# Patient Record
Sex: Male | Born: 1960 | Race: Black or African American | Hispanic: No | Marital: Single | Smoking: Former smoker
Health system: Southern US, Community
[De-identification: ages and names within clinical notes are randomized; demographics above are authoritative.]

## PROBLEM LIST (undated history)

## (undated) DIAGNOSIS — E7800 Pure hypercholesterolemia, unspecified: Secondary | ICD-10-CM

## (undated) DIAGNOSIS — K4090 Unilateral inguinal hernia, without obstruction or gangrene, not specified as recurrent: Secondary | ICD-10-CM

## (undated) DIAGNOSIS — M1990 Unspecified osteoarthritis, unspecified site: Secondary | ICD-10-CM

## (undated) DIAGNOSIS — R2232 Localized swelling, mass and lump, left upper limb: Secondary | ICD-10-CM

## (undated) HISTORY — PX: COLONOSCOPY: SHX174

## (undated) HISTORY — PX: HEMORROIDECTOMY: SUR656

## (undated) HISTORY — PX: HERNIA REPAIR: SHX51

---

## 1998-02-23 ENCOUNTER — Emergency Department (HOSPITAL_COMMUNITY): Admission: EM | Admit: 1998-02-23 | Discharge: 1998-02-23 | Payer: Self-pay | Admitting: Emergency Medicine

## 1998-02-23 ENCOUNTER — Encounter: Payer: Self-pay | Admitting: Emergency Medicine

## 1999-07-29 ENCOUNTER — Emergency Department (HOSPITAL_COMMUNITY): Admission: EM | Admit: 1999-07-29 | Discharge: 1999-07-29 | Payer: Self-pay | Admitting: *Deleted

## 2000-08-15 ENCOUNTER — Emergency Department (HOSPITAL_COMMUNITY): Admission: EM | Admit: 2000-08-15 | Discharge: 2000-08-15 | Payer: Self-pay | Admitting: Emergency Medicine

## 2000-11-21 ENCOUNTER — Emergency Department (HOSPITAL_COMMUNITY): Admission: EM | Admit: 2000-11-21 | Discharge: 2000-11-21 | Payer: Self-pay | Admitting: *Deleted

## 2000-11-21 ENCOUNTER — Encounter: Payer: Self-pay | Admitting: Emergency Medicine

## 2000-12-07 ENCOUNTER — Ambulatory Visit (HOSPITAL_COMMUNITY): Admission: RE | Admit: 2000-12-07 | Discharge: 2000-12-07 | Payer: Self-pay | Admitting: Emergency Medicine

## 2000-12-11 ENCOUNTER — Encounter: Admission: RE | Admit: 2000-12-11 | Discharge: 2000-12-11 | Payer: Self-pay | Admitting: Emergency Medicine

## 2000-12-18 ENCOUNTER — Ambulatory Visit (HOSPITAL_COMMUNITY): Admission: EM | Admit: 2000-12-18 | Discharge: 2000-12-18 | Payer: Self-pay | Admitting: Emergency Medicine

## 2001-01-01 ENCOUNTER — Ambulatory Visit (HOSPITAL_COMMUNITY): Admission: RE | Admit: 2001-01-01 | Discharge: 2001-01-01 | Payer: Self-pay | Admitting: Emergency Medicine

## 2002-06-21 ENCOUNTER — Emergency Department (HOSPITAL_COMMUNITY): Admission: EM | Admit: 2002-06-21 | Discharge: 2002-06-21 | Payer: Self-pay | Admitting: Emergency Medicine

## 2002-06-21 ENCOUNTER — Encounter: Payer: Self-pay | Admitting: Emergency Medicine

## 2002-06-24 ENCOUNTER — Emergency Department (HOSPITAL_COMMUNITY): Admission: EM | Admit: 2002-06-24 | Discharge: 2002-06-24 | Payer: Self-pay | Admitting: Emergency Medicine

## 2002-09-17 ENCOUNTER — Emergency Department (HOSPITAL_COMMUNITY): Admission: EM | Admit: 2002-09-17 | Discharge: 2002-09-17 | Payer: Self-pay | Admitting: Emergency Medicine

## 2002-09-19 ENCOUNTER — Emergency Department (HOSPITAL_COMMUNITY): Admission: EM | Admit: 2002-09-19 | Discharge: 2002-09-19 | Payer: Self-pay | Admitting: Emergency Medicine

## 2003-02-09 ENCOUNTER — Emergency Department (HOSPITAL_COMMUNITY): Admission: AD | Admit: 2003-02-09 | Discharge: 2003-02-09 | Payer: Self-pay | Admitting: Family Medicine

## 2003-05-31 ENCOUNTER — Emergency Department (HOSPITAL_COMMUNITY): Admission: EM | Admit: 2003-05-31 | Discharge: 2003-05-31 | Payer: Self-pay | Admitting: Family Medicine

## 2004-05-11 ENCOUNTER — Emergency Department (HOSPITAL_COMMUNITY): Admission: EM | Admit: 2004-05-11 | Discharge: 2004-05-11 | Payer: Self-pay | Admitting: Family Medicine

## 2004-11-28 ENCOUNTER — Emergency Department (HOSPITAL_COMMUNITY): Admission: EM | Admit: 2004-11-28 | Discharge: 2004-11-28 | Payer: Self-pay | Admitting: Family Medicine

## 2005-02-18 ENCOUNTER — Emergency Department (HOSPITAL_COMMUNITY): Admission: EM | Admit: 2005-02-18 | Discharge: 2005-02-18 | Payer: Self-pay | Admitting: Emergency Medicine

## 2005-04-01 ENCOUNTER — Emergency Department (HOSPITAL_COMMUNITY): Admission: EM | Admit: 2005-04-01 | Discharge: 2005-04-01 | Payer: Self-pay | Admitting: Family Medicine

## 2005-12-01 ENCOUNTER — Encounter: Admission: RE | Admit: 2005-12-01 | Discharge: 2005-12-01 | Payer: Self-pay | Admitting: Family Medicine

## 2006-07-13 ENCOUNTER — Encounter: Admission: RE | Admit: 2006-07-13 | Discharge: 2006-07-13 | Payer: Self-pay | Admitting: Family Medicine

## 2006-07-26 ENCOUNTER — Emergency Department (HOSPITAL_COMMUNITY): Admission: EM | Admit: 2006-07-26 | Discharge: 2006-07-26 | Payer: Self-pay | Admitting: Family Medicine

## 2007-08-28 ENCOUNTER — Emergency Department (HOSPITAL_COMMUNITY): Admission: EM | Admit: 2007-08-28 | Discharge: 2007-08-28 | Payer: Self-pay | Admitting: Emergency Medicine

## 2007-09-28 ENCOUNTER — Emergency Department (HOSPITAL_COMMUNITY): Admission: EM | Admit: 2007-09-28 | Discharge: 2007-09-28 | Payer: Self-pay | Admitting: Family Medicine

## 2009-02-17 ENCOUNTER — Emergency Department (HOSPITAL_COMMUNITY): Admission: EM | Admit: 2009-02-17 | Discharge: 2009-02-17 | Payer: Self-pay | Admitting: Emergency Medicine

## 2009-09-04 ENCOUNTER — Encounter: Admission: RE | Admit: 2009-09-04 | Discharge: 2009-09-04 | Payer: Self-pay | Admitting: General Surgery

## 2009-09-08 ENCOUNTER — Ambulatory Visit (HOSPITAL_BASED_OUTPATIENT_CLINIC_OR_DEPARTMENT_OTHER): Admission: RE | Admit: 2009-09-08 | Discharge: 2009-09-08 | Payer: Self-pay | Admitting: General Surgery

## 2010-04-17 LAB — POCT I-STAT, CHEM 8
BUN: 14 mg/dL (ref 6–23)
Calcium, Ion: 1.24 mmol/L (ref 1.12–1.32)
Chloride: 105 mEq/L (ref 96–112)
Creatinine, Ser: 1.1 mg/dL (ref 0.4–1.5)
Glucose, Bld: 91 mg/dL (ref 70–99)
HCT: 45 % (ref 39.0–52.0)
Hemoglobin: 15.3 g/dL (ref 13.0–17.0)
Potassium: 3.9 mEq/L (ref 3.5–5.1)
Sodium: 140 mEq/L (ref 135–145)
TCO2: 24 mmol/L (ref 0–100)

## 2010-05-31 ENCOUNTER — Emergency Department (HOSPITAL_COMMUNITY)
Admission: EM | Admit: 2010-05-31 | Discharge: 2010-05-31 | Disposition: A | Discharge status: Home / Self Care | Payer: Self-pay | Attending: Emergency Medicine | Admitting: Emergency Medicine

## 2010-05-31 DIAGNOSIS — T148XXA Other injury of unspecified body region, initial encounter: Secondary | ICD-10-CM | POA: Insufficient documentation

## 2010-05-31 DIAGNOSIS — M542 Cervicalgia: Secondary | ICD-10-CM | POA: Insufficient documentation
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
## 2010-10-30 LAB — POCT RAPID STREP A: Streptococcus, Group A Screen (Direct): POSITIVE — AB

## 2012-05-06 ENCOUNTER — Emergency Department (HOSPITAL_COMMUNITY)
Admission: EM | Admit: 2012-05-06 | Discharge: 2012-05-06 | Disposition: A | Discharge status: Home / Self Care | Payer: BC Managed Care – PPO | Attending: Emergency Medicine | Admitting: Emergency Medicine

## 2012-05-06 ENCOUNTER — Emergency Department (HOSPITAL_COMMUNITY): Payer: BC Managed Care – PPO

## 2012-05-06 ENCOUNTER — Encounter (HOSPITAL_COMMUNITY): Payer: Self-pay | Admitting: *Deleted

## 2012-05-06 DIAGNOSIS — R0789 Other chest pain: Secondary | ICD-10-CM | POA: Insufficient documentation

## 2012-05-06 DIAGNOSIS — Z880 Allergy status to penicillin: Secondary | ICD-10-CM | POA: Insufficient documentation

## 2012-05-06 DIAGNOSIS — Z86718 Personal history of other venous thrombosis and embolism: Secondary | ICD-10-CM | POA: Insufficient documentation

## 2012-05-06 DIAGNOSIS — R233 Spontaneous ecchymoses: Secondary | ICD-10-CM | POA: Insufficient documentation

## 2012-05-06 DIAGNOSIS — E7800 Pure hypercholesterolemia, unspecified: Secondary | ICD-10-CM | POA: Insufficient documentation

## 2012-05-06 DIAGNOSIS — R071 Chest pain on breathing: Secondary | ICD-10-CM | POA: Insufficient documentation

## 2012-05-06 HISTORY — DX: Pure hypercholesterolemia, unspecified: E78.00

## 2012-05-06 LAB — COMPREHENSIVE METABOLIC PANEL
ALT: 24 U/L (ref 0–53)
AST: 26 U/L (ref 0–37)
Albumin: 3.9 g/dL (ref 3.5–5.2)
Alkaline Phosphatase: 44 U/L (ref 39–117)
BUN: 15 mg/dL (ref 6–23)
CO2: 22 mEq/L (ref 19–32)
Calcium: 9.4 mg/dL (ref 8.4–10.5)
Chloride: 103 mEq/L (ref 96–112)
Creatinine, Ser: 0.91 mg/dL (ref 0.50–1.35)
GFR calc Af Amer: >90 mL/min (ref >90)
GFR calc non Af Amer: >90 mL/min (ref >90)
Glucose, Bld: 71 mg/dL (ref 70–99)
Potassium: 4 mEq/L (ref 3.5–5.1)
Sodium: 138 mEq/L (ref 135–145)
Total Bilirubin: 0.7 mg/dL (ref 0.3–1.2)
Total Protein: 7.2 g/dL (ref 6.0–8.3)

## 2012-05-06 LAB — CBC WITH DIFFERENTIAL/PLATELET
Basophils Absolute: 0 10*3/uL (ref 0.0–0.1)
Basophils Relative: 1 % (ref 0–1)
Eosinophils Absolute: 0.1 10*3/uL (ref 0.0–0.7)
Eosinophils Relative: 1 % (ref 0–5)
HCT: 40 % (ref 39.0–52.0)
Hemoglobin: 13.5 g/dL (ref 13.0–17.0)
Lymphocytes Relative: 20 % (ref 12–46)
Lymphs Abs: 1.4 10*3/uL (ref 0.7–4.0)
MCH: 28.9 pg (ref 26.0–34.0)
MCHC: 33.8 g/dL (ref 30.0–36.0)
MCV: 85.7 fL (ref 78.0–100.0)
Monocytes Absolute: 0.6 10*3/uL (ref 0.1–1.0)
Monocytes Relative: 8 % (ref 3–12)
Neutro Abs: 5 10*3/uL (ref 1.7–7.7)
Neutrophils Relative %: 71 % (ref 43–77)
Platelets: 194 10*3/uL (ref 150–400)
RBC: 4.67 MIL/uL (ref 4.22–5.81)
RDW: 13.7 % (ref 11.5–15.5)
WBC: 7.1 10*3/uL (ref 4.0–10.5)

## 2012-05-06 LAB — URINALYSIS, ROUTINE W REFLEX MICROSCOPIC
Bilirubin Urine: NEGATIVE
Glucose, UA: NEGATIVE mg/dL
Hgb urine dipstick: NEGATIVE
Ketones, ur: NEGATIVE mg/dL
Leukocytes, UA: NEGATIVE
Nitrite: NEGATIVE
Protein, ur: NEGATIVE mg/dL
Specific Gravity, Urine: 1.022 (ref 1.005–1.030)
Urobilinogen, UA: 0.2 mg/dL (ref 0.0–1.0)
pH: 6 (ref 5.0–8.0)

## 2012-05-06 LAB — TROPONIN I: Troponin I: <0.3 ng/mL (ref <0.30)

## 2012-05-06 MED ORDER — IBUPROFEN 800 MG PO TABS: 800.0000 mg | ORAL_TABLET | Freq: Three times a day (TID) | ORAL | Status: DC

## 2012-05-06 MED ORDER — IOHEXOL 350 MG/ML SOLN
100.0000 mL | Freq: Once | INTRAVENOUS | Status: AC | PRN
  Administered 2012-05-06: 100 mL via INTRAVENOUS

## 2012-05-06 NOTE — ED Notes (Signed)
Pt reports waking up this morning with bruise on left side of chest, with intermittent "soreness". Hx of superficial blood clot in right leg, pain similar to previous clot. Denies injury to chest. Pain does not radiate, but states bilat arms are "numb" from time to time. Not on blood thinners.

## 2012-05-06 NOTE — ED Notes (Addendum)
Pt aware of the need for a urine sample. Urinal at bedside. 

## 2012-05-06 NOTE — ED Notes (Signed)
Patient transported to CT 

## 2012-05-06 NOTE — ED Provider Notes (Signed)
History     CSN: [id number]  Arrival date & time 05/06/12  1038   First MD Initiated Contact with Patient 05/06/12 1046      Chief Complaint  Patient presents with  . Chest Pain    (Consider location/radiation/quality/duration/timing/severity/associated sxs/prior treatment) HPI Comments: Patient complains of left-sided chest pain has been constant since this morning at the site of the bruise. He does not know what caused the bruise.  He is worried because he was recently diagnosed with a blood clot in his right leg which was superficial. He is not on blood thinners .she was told to clot was not deep. Pain does not radiate it is worse with arm movement. Denies shortness of breath, cough or fever. Denies any cardiac history.  The history is provided by the patient.    Past Medical History  Diagnosis Date  . Hypercholesteremia     Past Surgical History  Procedure Laterality Date  . Hernia repair      No family history on file.  History  Substance Use Topics  . Smoking status: Never Smoker   . Smokeless tobacco: Not on file  . Alcohol Use: No      Review of Systems  Constitutional: Negative for activity change and appetite change.  Eyes: Negative for photophobia.  Respiratory: Positive for chest tightness. Negative for cough and shortness of breath.   Cardiovascular: Positive for chest pain.  Gastrointestinal: Negative for nausea, vomiting and abdominal pain.  Genitourinary: Negative for dysuria and hematuria.  Musculoskeletal: Negative for back pain.  Skin: Negative for rash.  Neurological: Negative for dizziness, weakness and headaches.  Hematological: Bruises/bleeds easily.  A complete 10 system review of systems was obtained and all systems are negative except as noted in the HPI and PMH.    Allergies  Penicillins  Home Medications   Current Outpatient Rx  Name  Route  Sig  Dispense  Refill  . ibuprofen (ADVIL,MOTRIN) 800 MG tablet   Oral   Take 1 tablet  (800 mg total) by mouth 3 (three) times daily.   21 tablet   0     BP 103/69  Pulse 52  Temp(Src) 98.7 F (37.1 C) (Oral)  Resp 13  SpO2 99%  Physical Exam  Constitutional: He is oriented to person, place, and time. He appears well-developed and well-nourished. No distress.  HENT:  Head: Normocephalic and atraumatic.  Mouth/Throat: Oropharynx is clear and moist. No oropharyngeal exudate.  Eyes: Conjunctivae and EOM are normal. Pupils are equal, round, and reactive to light.  Neck: Normal range of motion. Neck supple.  Cardiovascular: Normal rate, regular rhythm and normal heart sounds.   No murmur heard. Pulmonary/Chest: Effort normal and breath sounds normal. No respiratory distress. He exhibits tenderness.  1 cm area of ecchymosis to left chest wall it is tender  Abdominal: Soft. There is no tenderness. There is no rebound and no guarding.  Musculoskeletal: Normal range of motion. He exhibits no edema and no tenderness.  No calf asymmetry, no palpable cords  Neurological: He is alert and oriented to person, place, and time. No cranial nerve deficit. He exhibits normal muscle tone. Coordination normal.  Skin: Skin is warm.    ED Course  Procedures (including critical care time)  Labs Reviewed  CBC WITH DIFFERENTIAL  COMPREHENSIVE METABOLIC PANEL  TROPONIN I  URINALYSIS, ROUTINE W REFLEX MICROSCOPIC  CBC WITH DIFFERENTIAL   Dg Chest 2 View  05/06/2012  *RADIOLOGY REPORT*  Clinical Data: Chest pain  CHEST - 2  VIEW  Comparison: 04/03/2010  Findings: The heart size and mediastinal contours are within normal limits.  Both lungs are clear.  The visualized skeletal structures are unremarkable.  IMPRESSION: No active cardiopulmonary abnormalities.   Original Report Authenticated By: Signa Kell, M.D.    Ct Angio Chest Pe W/cm &/or Wo Cm  05/06/2012  *RADIOLOGY REPORT*  Clinical Data: Chest pain, history of deep venous thrombosis.  CT ANGIOGRAPHY CHEST  Technique:  Multidetector  CT imaging of the chest using the standard protocol during bolus administration of intravenous contrast. Multiplanar reconstructed images including MIPs were obtained and reviewed to evaluate the vascular anatomy.  Contrast: OMNIPAQUE IOHEXOL 350 MG/ML SOLN  Comparison: Chest radiograph 05/06/2012  Findings: There are no filling defects within the pulmonary arteries to suggest acute pulmonary embolism.  No acute findings of the aorta or great vessels.  No pericardial fluid.  Esophagus is normal.  No mediastinal lymphadenopathy.  No axillary or supraclavicular adenopathy.  Review of the lung parenchyma demonstrates no pneumothorax or pleural fluid.  Limited view of the upper abdomen is unremarkable.  Limited view of the skeleton demonstrates no evidence of fracture.  IMPRESSION: 1.  No evidence of acute pulmonary embolism. 2.  No acute thoracic abnormality.   Original Report Authenticated By: Genevive Bi, M.D.      1. Chest wall pain       MDM  Left-sided chest pain has been constant since this morning patient concerned because he has a recently diagnosed superficial blood clot in his right leg. Denies any shortness of breath nausea or vomiting.  Chest wall pain at site of ecchymosis. CTPE negative.  Treat chest wall pain supportively.  Low suspicion for ACS.   Date: 05/06/2012  Rate: 58  Rhythm: normal sinus rhythm  QRS Axis: normal  Intervals: normal  ST/T Wave abnormalities: normal  Conduction Disutrbances:none  Narrative Interpretation:   Old EKG Reviewed: unchanged      Glynn Octave, MD 05/06/12 1623

## 2012-05-08 ENCOUNTER — Emergency Department (HOSPITAL_COMMUNITY)
Admission: EM | Admit: 2012-05-08 | Discharge: 2012-05-08 | Disposition: A | Discharge status: Home / Self Care | Payer: BC Managed Care – PPO | Attending: Emergency Medicine | Admitting: Emergency Medicine

## 2012-05-08 ENCOUNTER — Encounter (HOSPITAL_COMMUNITY): Payer: Self-pay | Admitting: Emergency Medicine

## 2012-05-08 DIAGNOSIS — L0291 Cutaneous abscess, unspecified: Secondary | ICD-10-CM

## 2012-05-08 DIAGNOSIS — Z862 Personal history of diseases of the blood and blood-forming organs and certain disorders involving the immune mechanism: Secondary | ICD-10-CM | POA: Insufficient documentation

## 2012-05-08 DIAGNOSIS — L0231 Cutaneous abscess of buttock: Secondary | ICD-10-CM | POA: Insufficient documentation

## 2012-05-08 DIAGNOSIS — Z8639 Personal history of other endocrine, nutritional and metabolic disease: Secondary | ICD-10-CM | POA: Insufficient documentation

## 2012-05-08 NOTE — ED Notes (Signed)
Pt complain of abscess to left inner gluteal. Pt states abscess as being sore, no drainage or opened area noted.

## 2012-05-08 NOTE — ED Provider Notes (Signed)
History     CSN: [id number]  Arrival date & time 05/08/12  0506   First MD Initiated Contact with Patient 05/08/12 [phone]      Chief Complaint  Patient presents with  . Abscess    (Consider location/radiation/quality/duration/timing/severity/associated sxs/prior treatment) HPI Comments: 52 year old male presents emergency department complaining of an abscess to his left buttock region that he noticed 2 days ago. States the area has been increasing in size since. Area is painful only when he sits. He's been trying to apply warm compresses without relief. No drainage. Denies fever or chills.  Patient is a 52 y.o. male presenting with abscess. The history is provided by the patient.  Abscess Associated symptoms: no fever     Past Medical History  Diagnosis Date  . Hypercholesteremia     Past Surgical History  Procedure Laterality Date  . Hernia repair    . Cholecystectomy      History reviewed. No pertinent family history.  History  Substance Use Topics  . Smoking status: Never Smoker   . Smokeless tobacco: Not on file  . Alcohol Use: No      Review of Systems  Constitutional: Negative for fever and chills.  Skin:       Positive for abscess.  All other systems reviewed and are negative.    Allergies  Penicillins  Home Medications   Current Outpatient Rx  Name  Route  Sig  Dispense  Refill  . ibuprofen (ADVIL,MOTRIN) 800 MG tablet   Oral   Take 1 tablet (800 mg total) by mouth 3 (three) times daily.   21 tablet   0     BP 127/79  Pulse 65  Temp(Src) 97.6 F (36.4 C) (Oral)  Resp 19  Ht 5\' 9"  (1.753 m)  Wt 220 lb (99.791 kg)  BMI 32.47 kg/m2  SpO2 98%  Physical Exam  Nursing note and vitals reviewed. Constitutional: He is oriented to person, place, and time. He appears well-developed and well-nourished. No distress.  HENT:  Head: Normocephalic and atraumatic.  Mouth/Throat: Oropharynx is clear and moist.  Eyes: Conjunctivae are normal.   Neck: Normal range of motion. Neck supple.  Cardiovascular: Normal rate, regular rhythm and normal heart sounds.   Pulmonary/Chest: Effort normal and breath sounds normal.  Musculoskeletal: Normal range of motion. He exhibits no edema.  Neurological: He is alert and oriented to person, place, and time.  Skin: Skin is warm and dry. He is not diaphoretic.  1 inch diameter indurated abscess without surrounding cellulitis present on left buttock region. No fluctuance or drainage. Tender to palpation.  Psychiatric: He has a normal mood and affect. His behavior is normal.    ED Course  Procedures (including critical care time) INCISION AND DRAINAGE Performed by: Johnnette Gourd Consent: Verbal consent obtained. Risks and benefits: risks, benefits and alternatives were discussed Type: abscess  Body area: left buttock  Anesthesia: local infiltration  Incision was made with a scalpel.  Local anesthetic: lidocaine 2% with epinephrine  Anesthetic total: 3 ml  Complexity: complex Blunt dissection to break up loculations  Drainage: purulent  Drainage amount: large  Patient tolerance: Patient tolerated the procedure well with no immediate complications.     1. Abscess       MDM  52 y/o male with abscess to left buttock. No surrounding cellulitis. Abscess drained with large amount of purulent material expressed. He will f/u with his PCP in 2 days for recheck. Advised him to use warm compresses. Afebrile, normal vital  signs. Return precautions discussed. Patient states understanding of plan and is agreeable.         Trevor Mace, PA-C 05/08/12 [phone]

## 2012-05-09 NOTE — ED Provider Notes (Signed)
Medical screening examination/treatment/procedure(s) were performed by non-physician practitioner and as supervising physician I was immediately available for consultation/collaboration.  Sunnie Nielsen, MD 05/09/12 [phone]

## 2012-10-24 ENCOUNTER — Ambulatory Visit: Payer: Self-pay | Admitting: Medical

## 2013-01-10 ENCOUNTER — Ambulatory Visit (INDEPENDENT_AMBULATORY_CARE_PROVIDER_SITE_OTHER): Payer: BC Managed Care – PPO

## 2013-01-10 VITALS — BP 116/69 | HR 53 | Resp 24 | Ht 69.0 in | Wt 220.0 lb

## 2013-01-10 DIAGNOSIS — M79671 Pain in right foot: Secondary | ICD-10-CM

## 2013-01-10 DIAGNOSIS — M79609 Pain in unspecified limb: Secondary | ICD-10-CM

## 2013-01-10 DIAGNOSIS — M722 Plantar fascial fibromatosis: Secondary | ICD-10-CM

## 2013-01-10 MED ORDER — MELOXICAM 15 MG PO TABS: 15.0000 mg | ORAL_TABLET | Freq: Every day | ORAL | Status: DC

## 2013-01-10 NOTE — Progress Notes (Signed)
   Subjective:    Patient ID: Adam Barber, male    DOB: 1960/09/02, 52 y.o.   MRN: [id number]  "My heel, I think there's a bone spur in it.  I had one doctor look at it and pump some cortisone in there."  Foot Pain This is a new ( Heel Pain) problem. Episode onset: 3 months. The problem occurs daily. The problem has been gradually worsening. The symptoms are aggravated by walking and standing (working). Treatments tried: Podiatrist over off [address], cortisone shot,  The treatment provided mild relief.      Review of Systems  Musculoskeletal: Positive for back pain.  All other systems reviewed and are negative.       Objective:   Physical Exam Neurovascular status is intact with pedal pulses palpable bilateral. Epicritic and proprioceptive sensations intact and symmetric. X-rays reveal retrocalcaneal spurring on lateral projection no inferior calcaneal spur. No fractures or other S. abnormalities noted. There is thickening of fascial structures noted radiographically. Clinically there is pain on palpation Magan plantar fascia from mid arch inferior calcaneal tubercle on the right left foot is asymptomatic. Patient wearing shoes that are flexing in the mid arch not toe area. Neurologically skin color pigment normal hair growth absent nails somewhat criptotic no other findings or changes no open wounds or ulcerations noted       Assessment & Plan:  Assessment plantar fasciitis/heel spur syndrome right foot plan at this time patient placed in a fascial strapping of the right foot prescription for Mobic 15 mg daily x30 tablets with refills also ice to the heel every evening maintain good stable shoes at all times considered crocs around the house. Reappointed in 2-3 weeks for followup  Alvan Dame DPM

## 2013-01-10 NOTE — Patient Instructions (Signed)

## 2013-01-15 ENCOUNTER — Telehealth: Payer: Self-pay | Admitting: *Deleted

## 2013-01-15 NOTE — Telephone Encounter (Signed)
Pt ask what kind of shoes he should wear.  Dr Dionne Bucy last note states he needs to wear shoes that are firm in the arch, and bend in the toe area, and Crocs around the house.  I left those orders on pt's voicemail.

## 2013-01-31 ENCOUNTER — Ambulatory Visit: Payer: BC Managed Care – PPO

## 2014-02-04 IMAGING — CT CT ANGIO CHEST
1 of 2 series · 19 of 32 positions shown · IV contrast (OMNIPAQUE 350)
Comparison: Chest radiograph 05/06/2012

CLINICAL DATA: Chest pain, history of deep venous thrombosis.

CT ANGIOGRAPHY CHEST
TECHNIQUE: Multidetector CT imaging of the chest using the
standard protocol during bolus administration of intravenous
contrast. Multiplanar reconstructed images including MIPs were
obtained and reviewed to evaluate the vascular anatomy.
Contrast: 100mL OMNIPAQUE IOHEXOL 350 MG/ML SOLN

[Series 8: thins for pacs · axial · 0.74mm/px · z∈[+1624,+1826]mm · 19 of 226 slices shown]
[im 12/226  lung]
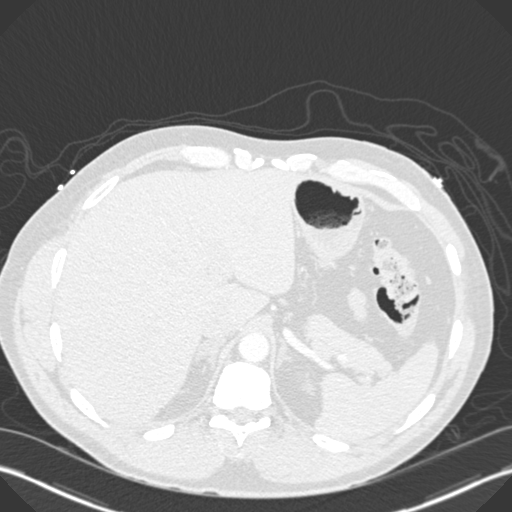
[im 23/226  mediastinal]
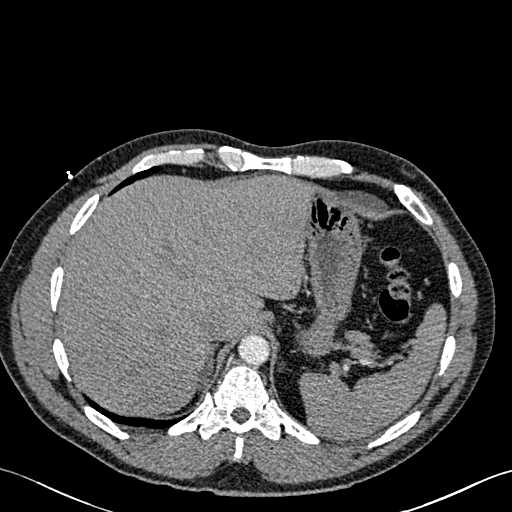
[im 34/226  lung]
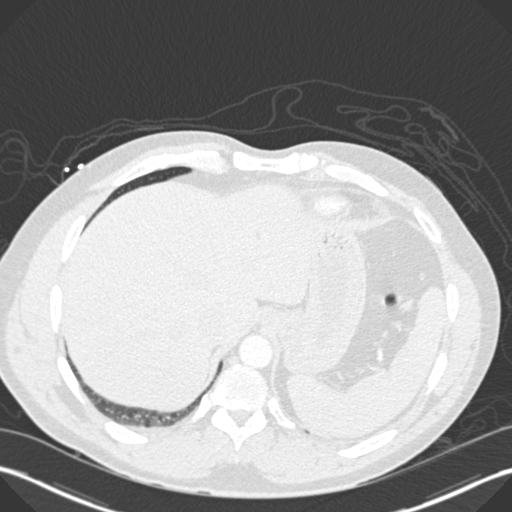
[im 57/226  mediastinal]
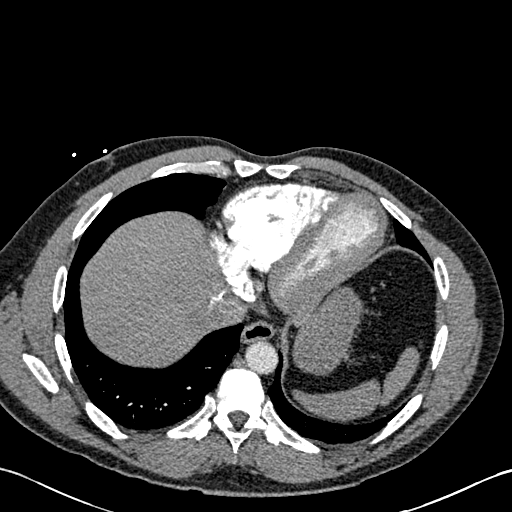
[im 68/226  lung]
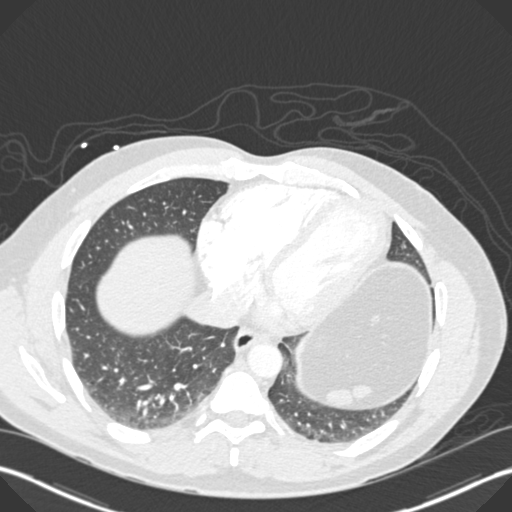
[im 76/226  mediastinal]
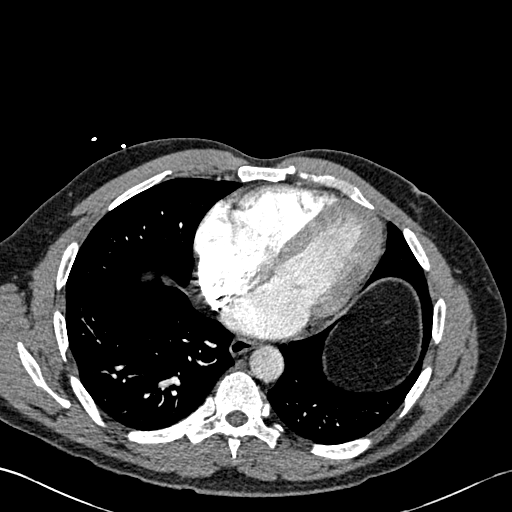
[im 79/226  lung]
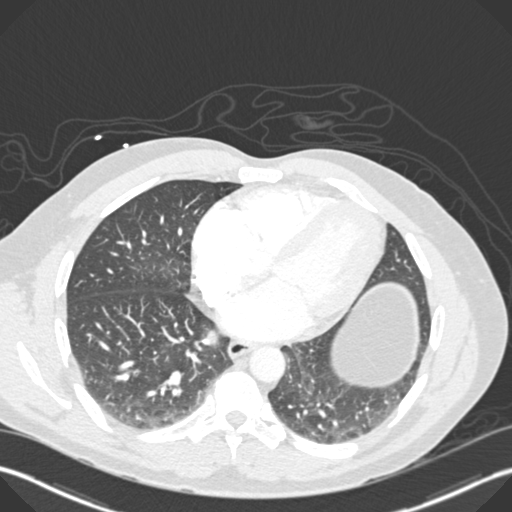
[im 91/226  mediastinal]
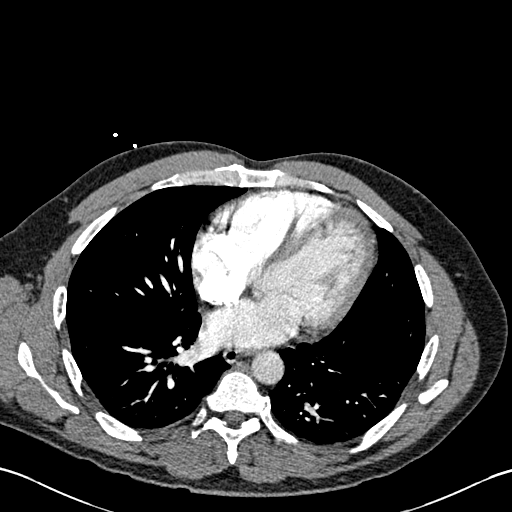
[im 102/226  lung]
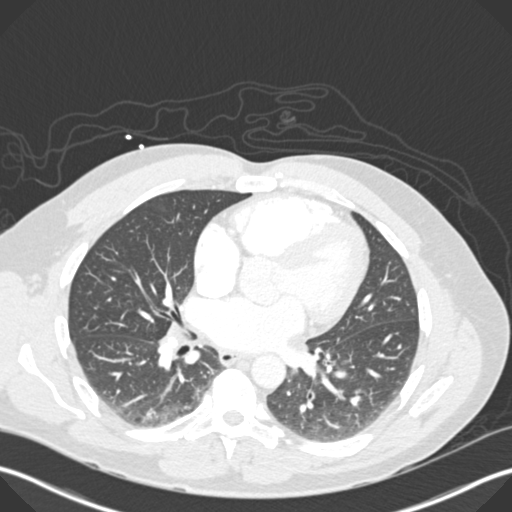
[im 113/226  mediastinal]
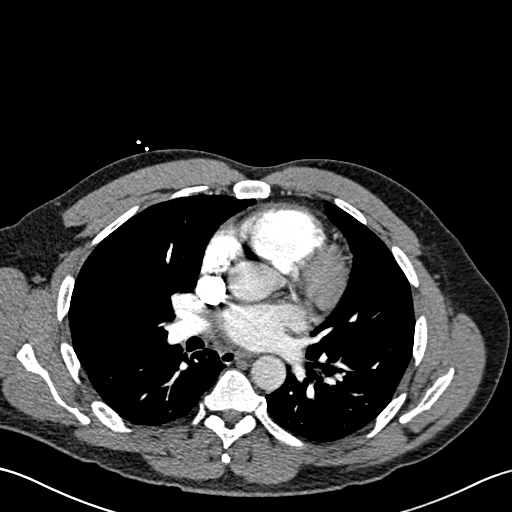
[im 124/226  lung]
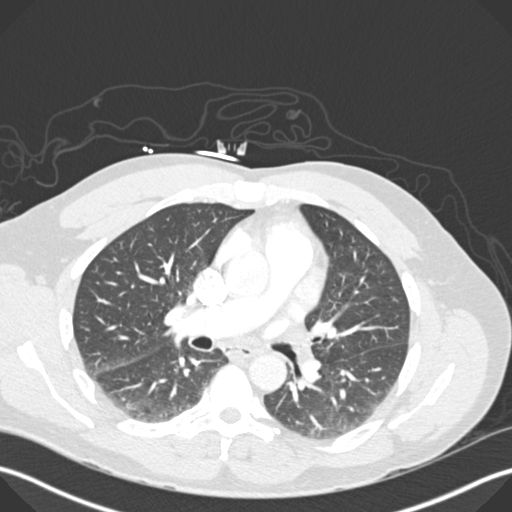
[im 136/226  mediastinal]
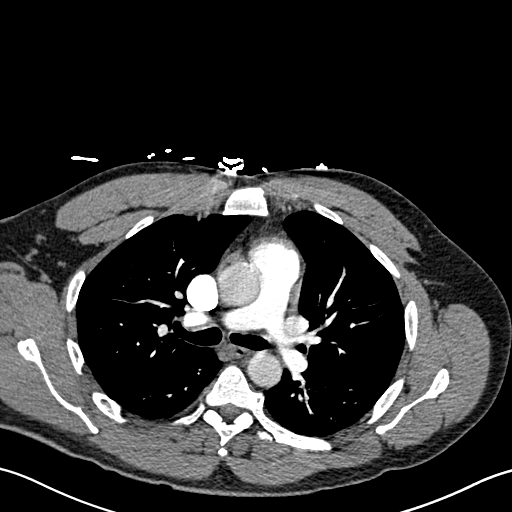
[im 147/226  lung]
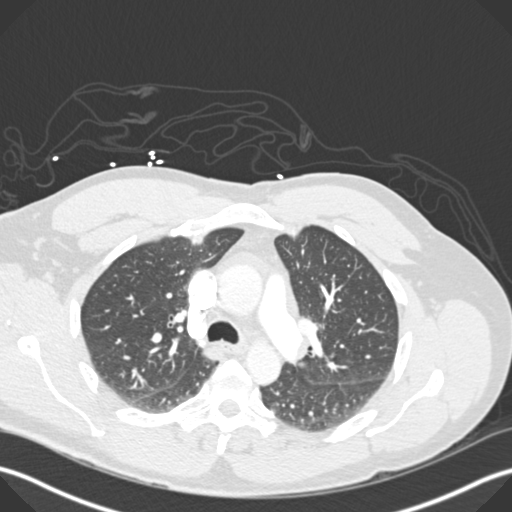
[im 151/226  mediastinal]
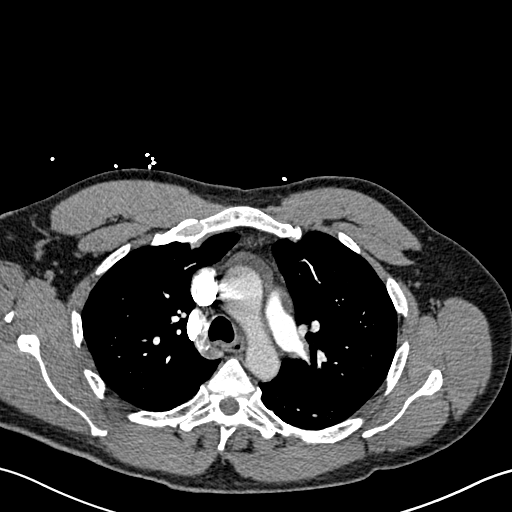
[im 158/226  lung]
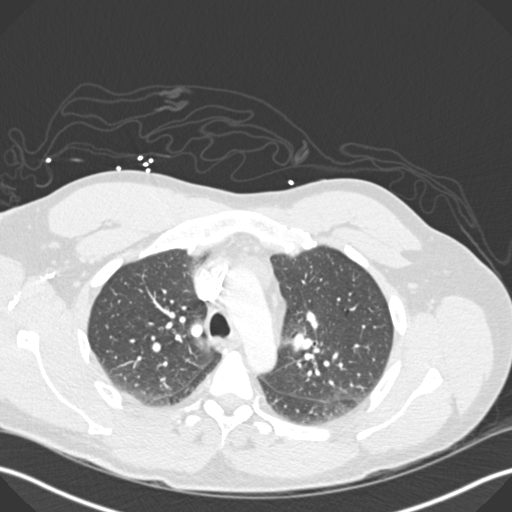
[im 169/226  mediastinal]
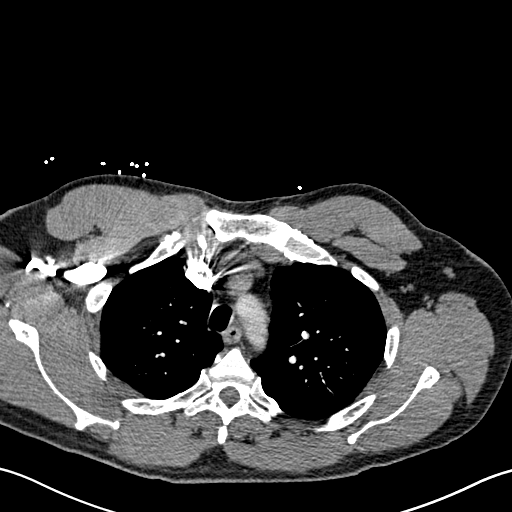
[im 192/226  lung]
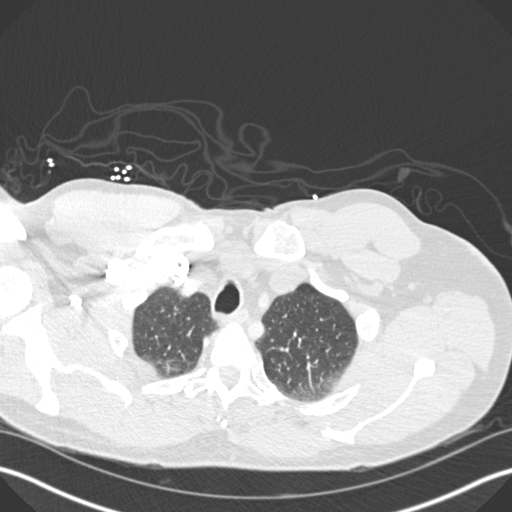
[im 203/226  mediastinal]
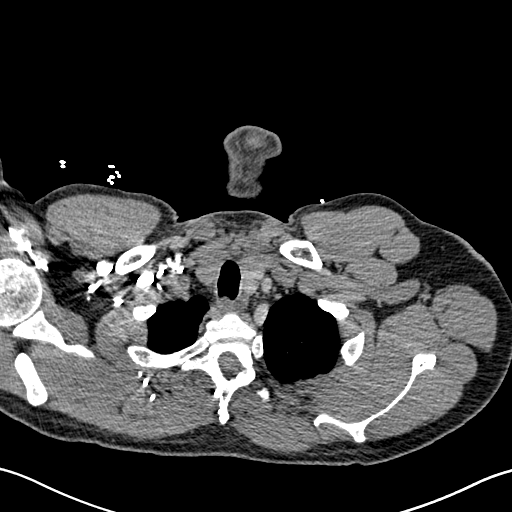
[im 214/226  lung]
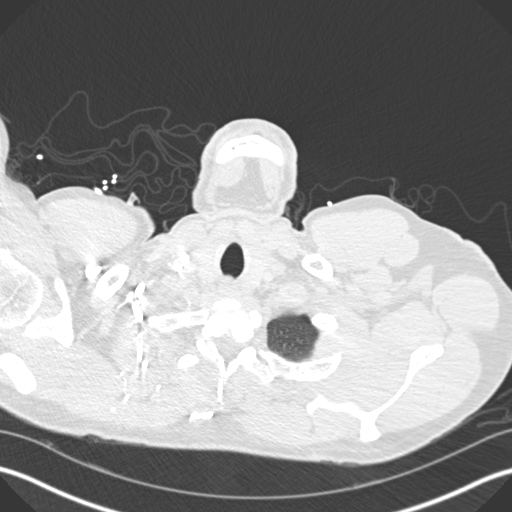

[19 of 32 positions shown; findings below may reference images not displayed]

FINDINGS: There are no filling defects within the pulmonary
arteries to suggest acute pulmonary embolism.  No acute findings of
the aorta or great vessels.  No pericardial fluid.  Esophagus is
normal.  No mediastinal lymphadenopathy.  No axillary or
supraclavicular adenopathy.

Review of the lung parenchyma demonstrates no pneumothorax or
pleural fluid.

Limited view of the upper abdomen is unremarkable.

Limited view of the skeleton demonstrates no evidence of fracture.
IMPRESSION: 1.  No evidence of acute pulmonary embolism.
2.  No acute thoracic abnormality.

## 2015-01-30 ENCOUNTER — Other Ambulatory Visit: Payer: Self-pay | Admitting: Surgery

## 2015-01-31 ENCOUNTER — Encounter (HOSPITAL_BASED_OUTPATIENT_CLINIC_OR_DEPARTMENT_OTHER): Payer: Self-pay | Admitting: *Deleted

## 2015-02-05 NOTE — H&P (Signed)
Adam Barber 01/30/2015 9:25 AM Location: [facility] Patient #: [id number] DOB: [birth year] Single / Language: [place] / Race: Black or African American Male   History of Present Illness (Herbert Aguinaldo A. Magnus IvanBlackman MD; 01/30/2015 9:36 AM) Patient words: New-Hernia.  The patient is a 55 year old male who presents with an inguinal hernia. This is a pleasant gentleman referred to me by Dr. Lupe Carneyean Mitchell for evaluation of a right inguinal hernia. He reports noticing the hernia about a year ago. It is now getting larger and causing some slight discomfort. The pain is described as very mild and a dull ache. It is intermittent. He reports that the hernia always easily reduces. He has no obstructive symptoms. He has had a previous open left inguinal hernia repair approximately 5 years ago. He is otherwise without complaints.   Other Problems Doristine Devoid(Chemira Jones, CMA; 01/30/2015 9:25 AM) Back Pain Hypercholesterolemia  Diagnostic Studies History Doristine Devoid(Chemira Jones, CMA; 01/30/2015 9:25 AM) Colonoscopy 5-10 years ago  Allergies Doristine Devoid(Chemira Jones, CMA; 01/30/2015 9:26 AM) Penicillin G Sodium *PENICILLINS*  Medication History Doristine Devoid(Chemira Jones, CMA; 01/30/2015 9:26 AM) No Current Medications Medications Reconciled  Social History Doristine Devoid(Chemira Jones, CMA; 01/30/2015 9:25 AM) Caffeine use Coffee. No drug use Tobacco use Former smoker.  Family History Doristine Devoid(Chemira Jones, CMA; 01/30/2015 9:25 AM) Hypertension Sister.    Review of Systems Doristine Devoid(Chemira Jones CMA; 01/30/2015 9:25 AM) General Present- Weight Gain. Not Present- Appetite Loss, Chills, Fatigue, Fever, Night Sweats and Weight Loss. Skin Not Present- Change in Wart/Mole, Dryness, Hives, Jaundice, New Lesions, Non-Healing Wounds, Rash and Ulcer. HEENT Not Present- Earache, Hearing Loss, Hoarseness, Nose Bleed, Oral Ulcers, Ringing in the Ears, Seasonal Allergies, Sinus Pain, Sore Throat, Visual Disturbances, Wears glasses/contact lenses  and Yellow Eyes. Respiratory Not Present- Bloody sputum, Chronic Cough, Difficulty Breathing, Snoring and Wheezing. Breast Not Present- Breast Mass, Breast Pain, Nipple Discharge and Skin Changes. Cardiovascular Not Present- Chest Pain, Difficulty Breathing Lying Down, Leg Cramps, Palpitations, Rapid Heart Rate, Shortness of Breath and Swelling of Extremities. Gastrointestinal Not Present- Abdominal Pain, Bloating, Bloody Stool, Change in Bowel Habits, Chronic diarrhea, Constipation, Difficulty Swallowing, Excessive gas, Gets full quickly at meals, Hemorrhoids, Indigestion, Nausea, Rectal Pain and Vomiting. Male Genitourinary Not Present- Blood in Urine, Change in Urinary Stream, Frequency, Impotence, Nocturia, Painful Urination, Urgency and Urine Leakage. Musculoskeletal Present- Back Pain. Not Present- Joint Pain, Joint Stiffness, Muscle Pain, Muscle Weakness and Swelling of Extremities. Neurological Present- Trouble walking. Not Present- Decreased Memory, Fainting, Headaches, Numbness, Seizures, Tingling, Tremor and Weakness. Psychiatric Not Present- Anxiety, Bipolar, Change in Sleep Pattern, Depression, Fearful and Frequent crying. Endocrine Not Present- Cold Intolerance, Excessive Hunger, Hair Changes, Heat Intolerance, Hot flashes and New Diabetes. Hematology Not Present- Easy Bruising, Excessive bleeding, Gland problems, HIV and Persistent Infections.  Vitals (Chemira Jones CMA; 01/30/2015 9:26 AM) 01/30/2015 9:25 AM Weight: 208 lb Height: 69in Body Surface Area: 2.1 m Body Mass Index: 30.72 kg/m  Temp.: 98.47F(Oral)  Pulse: 70 (Regular)  BP: 114/68 (Sitting, Left Arm, Standard)       Physical Exam (Kaliana Albino A. Magnus IvanBlackman MD; 01/30/2015 9:36 AM) General Mental Status-Alert. General Appearance-Consistent with stated age. Hydration-Well hydrated. Voice-Normal.  Head and Neck Head-normocephalic, atraumatic with no lesions or palpable  masses. Trachea-midline.  Eye Eyeball - Bilateral-Extraocular movements intact. Sclera/Conjunctiva - Bilateral-No scleral icterus.  Chest and Lung Exam Chest and lung exam reveals -quiet, even and easy respiratory effort with no use of accessory muscles and on auscultation, normal breath sounds, no adventitious sounds and normal vocal resonance. Inspection Chest  Wall - Normal. Back - normal.  Cardiovascular Cardiovascular examination reveals -normal heart sounds, regular rate and rhythm with no murmurs and normal pedal pulses bilaterally.  Abdomen Inspection Skin - Scar - no surgical scars. Hernias - Inguinal hernia - Right - Reducible. Palpation/Percussion Palpation and Percussion of the abdomen reveal - Soft, Non Tender, No Rebound tenderness, No Rigidity (guarding) and No hepatosplenomegaly. Auscultation Auscultation of the abdomen reveals - Bowel sounds normal.  Neurologic Neurologic evaluation reveals -alert and oriented x 3 with no impairment of recent or remote memory. Mental Status-Normal.  Musculoskeletal Normal Exam - Left-Upper Extremity Strength Normal and Lower Extremity Strength Normal. Normal Exam - Right-Upper Extremity Strength Normal, Lower Extremity Weakness.    Assessment & Plan (Zaniel Marineau A. Magnus Ivan MD; 01/30/2015 9:37 AM) RIGHT INGUINAL HERNIA (K40.90) Impression: I discussed the diagnosis with him in detail. As he has had previous open hernia repair with mesh, he is interested in laparoscopic right inguinal hernia repair with mesh. I discussed the surgical procedure with him in detail. I discussed the risks of surgery which includes but is not limited to bleeding, infection, recurrent hernia, injury to surrounding structures, chronic pain, nerve entrapment, postoperative recovery, etc. I gave him literature regarding this. He understands and wishes to proceed with laparoscopic right inguinal hernia repair with mesh Current Plans Pt  Education - Pamphlet Given - Laparoscopic Hernia Repair: discussed with patient and provided information.

## 2015-02-06 ENCOUNTER — Ambulatory Visit (HOSPITAL_BASED_OUTPATIENT_CLINIC_OR_DEPARTMENT_OTHER): Payer: BC Managed Care – PPO | Admitting: Anesthesiology

## 2015-02-06 ENCOUNTER — Encounter (HOSPITAL_BASED_OUTPATIENT_CLINIC_OR_DEPARTMENT_OTHER)
Admission: RE | Disposition: A | Discharge status: Home / Self Care | Payer: Self-pay | Source: Ambulatory Visit | Attending: Surgery

## 2015-02-06 ENCOUNTER — Ambulatory Visit (HOSPITAL_BASED_OUTPATIENT_CLINIC_OR_DEPARTMENT_OTHER)
Admission: RE | Admit: 2015-02-06 | Discharge: 2015-02-06 | Disposition: A | Discharge status: Home / Self Care | Payer: BC Managed Care – PPO | Source: Ambulatory Visit | Attending: Surgery | Admitting: Surgery

## 2015-02-06 ENCOUNTER — Encounter (HOSPITAL_BASED_OUTPATIENT_CLINIC_OR_DEPARTMENT_OTHER): Payer: Self-pay | Admitting: *Deleted

## 2015-02-06 DIAGNOSIS — M1990 Unspecified osteoarthritis, unspecified site: Secondary | ICD-10-CM | POA: Diagnosis not present

## 2015-02-06 DIAGNOSIS — Z5331 Laparoscopic surgical procedure converted to open procedure: Secondary | ICD-10-CM | POA: Diagnosis not present

## 2015-02-06 DIAGNOSIS — Z6830 Body mass index (BMI) 30.0-30.9, adult: Secondary | ICD-10-CM | POA: Diagnosis not present

## 2015-02-06 DIAGNOSIS — E7800 Pure hypercholesterolemia, unspecified: Secondary | ICD-10-CM | POA: Insufficient documentation

## 2015-02-06 DIAGNOSIS — Z87891 Personal history of nicotine dependence: Secondary | ICD-10-CM | POA: Diagnosis not present

## 2015-02-06 DIAGNOSIS — E669 Obesity, unspecified: Secondary | ICD-10-CM | POA: Diagnosis not present

## 2015-02-06 DIAGNOSIS — K4090 Unilateral inguinal hernia, without obstruction or gangrene, not specified as recurrent: Secondary | ICD-10-CM | POA: Diagnosis not present

## 2015-02-06 HISTORY — PX: INGUINAL HERNIA REPAIR: SHX194

## 2015-02-06 HISTORY — PX: INSERTION OF MESH: SHX5868

## 2015-02-06 HISTORY — DX: Unspecified osteoarthritis, unspecified site: M19.90

## 2015-02-06 HISTORY — DX: Unilateral inguinal hernia, without obstruction or gangrene, not specified as recurrent: K40.90

## 2015-02-06 SURGERY — REPAIR, HERNIA, INGUINAL, LAPAROSCOPIC
Anesthesia: General | Site: Abdomen | Laterality: Right

## 2015-02-06 MED ORDER — DEXAMETHASONE SODIUM PHOSPHATE 4 MG/ML IJ SOLN
INTRAMUSCULAR | Status: DC | PRN
  Administered 2015-02-06: 10 mg via INTRAVENOUS

## 2015-02-06 MED ORDER — LIDOCAINE HCL (CARDIAC) 10 MG/ML IV SOLN
INTRAVENOUS | Status: DC | PRN
  Administered 2015-02-06: 60 mg via INTRAVENOUS

## 2015-02-06 MED ORDER — HYDROCODONE-ACETAMINOPHEN 5-325 MG PO TABS: 1.0000 | ORAL_TABLET | ORAL | Status: DC | PRN

## 2015-02-06 MED ORDER — PROPOFOL 10 MG/ML IV BOLUS
INTRAVENOUS | Status: DC | PRN
  Administered 2015-02-06: 200 mg via INTRAVENOUS

## 2015-02-06 MED ORDER — CIPROFLOXACIN IN D5W 400 MG/200ML IV SOLN
400.0000 mg | INTRAVENOUS | Status: AC
  Administered 2015-02-06: 400 mg via INTRAVENOUS

## 2015-02-06 MED ORDER — MIDAZOLAM HCL 2 MG/2ML IJ SOLN
INTRAMUSCULAR | Status: AC
  Filled 2015-02-06: qty 2

## 2015-02-06 MED ORDER — OXYCODONE HCL 5 MG PO TABS
5.0000 mg | ORAL_TABLET | Freq: Once | ORAL | Status: AC | PRN
  Administered 2015-02-06: 5 mg via ORAL

## 2015-02-06 MED ORDER — KETOROLAC TROMETHAMINE 30 MG/ML IJ SOLN
INTRAMUSCULAR | Status: AC
  Filled 2015-02-06: qty 1

## 2015-02-06 MED ORDER — FENTANYL CITRATE (PF) 100 MCG/2ML IJ SOLN
INTRAMUSCULAR | Status: AC
  Filled 2015-02-06: qty 2

## 2015-02-06 MED ORDER — ONDANSETRON HCL 4 MG/2ML IJ SOLN
INTRAMUSCULAR | Status: AC
  Filled 2015-02-06: qty 2

## 2015-02-06 MED ORDER — ROCURONIUM BROMIDE 50 MG/5ML IV SOLN
INTRAVENOUS | Status: AC
  Filled 2015-02-06: qty 1

## 2015-02-06 MED ORDER — DEXAMETHASONE SODIUM PHOSPHATE 10 MG/ML IJ SOLN
INTRAMUSCULAR | Status: AC
  Filled 2015-02-06: qty 1

## 2015-02-06 MED ORDER — MIDAZOLAM HCL 2 MG/2ML IJ SOLN
1.0000 mg | INTRAMUSCULAR | Status: DC | PRN
  Administered 2015-02-06: 2 mg via INTRAVENOUS

## 2015-02-06 MED ORDER — ONDANSETRON HCL 4 MG/2ML IJ SOLN: 4.0000 mg | Freq: Four times a day (QID) | INTRAMUSCULAR | Status: DC | PRN

## 2015-02-06 MED ORDER — SUGAMMADEX SODIUM 200 MG/2ML IV SOLN
INTRAVENOUS | Status: AC
  Filled 2015-02-06: qty 2

## 2015-02-06 MED ORDER — BUPIVACAINE-EPINEPHRINE (PF) 0.5% -1:200000 IJ SOLN
INTRAMUSCULAR | Status: DC | PRN
  Administered 2015-02-06: 30 mL via PERINEURAL

## 2015-02-06 MED ORDER — HYDROMORPHONE HCL 1 MG/ML IJ SOLN: 0.2500 mg | INTRAMUSCULAR | Status: DC | PRN

## 2015-02-06 MED ORDER — CIPROFLOXACIN IN D5W 400 MG/200ML IV SOLN
INTRAVENOUS | Status: AC
  Filled 2015-02-06: qty 200

## 2015-02-06 MED ORDER — ONDANSETRON HCL 4 MG/2ML IJ SOLN
INTRAMUSCULAR | Status: DC | PRN
  Administered 2015-02-06: 4 mg via INTRAVENOUS

## 2015-02-06 MED ORDER — SUGAMMADEX SODIUM 500 MG/5ML IV SOLN
INTRAVENOUS | Status: DC | PRN
  Administered 2015-02-06: 200 mg via INTRAVENOUS

## 2015-02-06 MED ORDER — OXYCODONE HCL 5 MG PO TABS
ORAL_TABLET | ORAL | Status: AC
  Filled 2015-02-06: qty 1

## 2015-02-06 MED ORDER — GLYCOPYRROLATE 0.2 MG/ML IJ SOLN: 0.2000 mg | Freq: Once | INTRAMUSCULAR | Status: DC | PRN

## 2015-02-06 MED ORDER — LIDOCAINE HCL (CARDIAC) 20 MG/ML IV SOLN
INTRAVENOUS | Status: AC
  Filled 2015-02-06: qty 5

## 2015-02-06 MED ORDER — ROCURONIUM BROMIDE 100 MG/10ML IV SOLN
INTRAVENOUS | Status: DC | PRN
  Administered 2015-02-06: 10 mg via INTRAVENOUS
  Administered 2015-02-06: 30 mg via INTRAVENOUS

## 2015-02-06 MED ORDER — OXYCODONE HCL 5 MG/5ML PO SOLN: 5.0000 mg | Freq: Once | ORAL | Status: AC | PRN

## 2015-02-06 MED ORDER — SCOPOLAMINE 1 MG/3DAYS TD PT72: 1.0000 | MEDICATED_PATCH | Freq: Once | TRANSDERMAL | Status: DC | PRN

## 2015-02-06 MED ORDER — FENTANYL CITRATE (PF) 100 MCG/2ML IJ SOLN
50.0000 ug | INTRAMUSCULAR | Status: AC | PRN
  Administered 2015-02-06: 50 ug via INTRAVENOUS
  Administered 2015-02-06: 100 ug via INTRAVENOUS
  Administered 2015-02-06: 50 ug via INTRAVENOUS

## 2015-02-06 MED ORDER — LACTATED RINGERS IV SOLN
INTRAVENOUS | Status: DC
  Administered 2015-02-06: 14:00:00 via INTRAVENOUS
  Administered 2015-02-06: 10 mL/h via INTRAVENOUS

## 2015-02-06 MED ORDER — KETOROLAC TROMETHAMINE 30 MG/ML IJ SOLN
INTRAMUSCULAR | Status: DC | PRN
  Administered 2015-02-06: 30 mg via INTRAVENOUS

## 2015-02-06 SURGICAL SUPPLY — 37 items
APPLIER CLIP LOGIC TI 5 (MISCELLANEOUS) ×1 IMPLANT
APR CLP MED LRG 33X5 (MISCELLANEOUS) ×2
BLADE CLIPPER SURG (BLADE) ×1 IMPLANT
BLADE HEX COATED 2.75 (ELECTRODE) ×1 IMPLANT
CHLORAPREP W/TINT 26ML (MISCELLANEOUS) ×3 IMPLANT
DEVICE SECURE STRAP 25 ABSORB (INSTRUMENTS) ×3 IMPLANT
DISSECT BALLN SPACEMKR + OVL (BALLOONS) ×6
DISSECTOR BALLN SPACEMKR + OVL (BALLOONS) ×3 IMPLANT
DISSECTOR BLUNT TIP ENDO 5MM (MISCELLANEOUS) IMPLANT
DRAIN PENROSE 1/2X12 LTX STRL (WOUND CARE) ×1 IMPLANT
ELECT REM PT RETURN 9FT ADLT (ELECTROSURGICAL) ×3
ELECTRODE REM PT RTRN 9FT ADLT (ELECTROSURGICAL) ×2 IMPLANT
GLOVE BIOGEL PI IND STRL 7.0 (GLOVE) IMPLANT
GLOVE BIOGEL PI INDICATOR 7.0 (GLOVE) ×1
GLOVE ECLIPSE 7.0 STRL STRAW (GLOVE) ×1 IMPLANT
GLOVE SURG SIGNA 7.5 PF LTX (GLOVE) ×3 IMPLANT
GOWN STRL REUS W/ TWL LRG LVL3 (GOWN DISPOSABLE) ×2 IMPLANT
GOWN STRL REUS W/ TWL XL LVL3 (GOWN DISPOSABLE) ×2 IMPLANT
GOWN STRL REUS W/TWL LRG LVL3 (GOWN DISPOSABLE) ×3
GOWN STRL REUS W/TWL XL LVL3 (GOWN DISPOSABLE) ×3
LIQUID BAND (GAUZE/BANDAGES/DRESSINGS) ×6 IMPLANT
MESH PARIETEX PROGRIP RIGHT (Mesh General) ×2 IMPLANT
NDL INSUFFLATION 14GA 120MM (NEEDLE) IMPLANT
NEEDLE INSUFFLATION 14GA 120MM (NEEDLE) IMPLANT
PACK BASIN DAY SURGERY FS (CUSTOM PROCEDURE TRAY) ×3 IMPLANT
PENCIL BUTTON HOLSTER BLD 10FT (ELECTRODE) ×1 IMPLANT
SCISSORS LAP 5X35 DISP (ENDOMECHANICALS) IMPLANT
SET IRRIG TUBING LAPAROSCOPIC (IRRIGATION / IRRIGATOR) IMPLANT
SET TROCAR LAP APPLE-HUNT 5MM (ENDOMECHANICALS) ×3 IMPLANT
SLEEVE SCD COMPRESS KNEE MED (MISCELLANEOUS) ×3 IMPLANT
SUT MNCRL AB 4-0 PS2 18 (SUTURE) ×3 IMPLANT
SUT VIC AB 2-0 SH 27 (SUTURE) ×3
SUT VIC AB 2-0 SH 27XBRD (SUTURE) IMPLANT
TOWEL OR 17X24 6PK STRL BLUE (TOWEL DISPOSABLE) ×3 IMPLANT
TRAY FOLEY CATH SILVER 16FR (SET/KITS/TRAYS/PACK) ×3 IMPLANT
TRAY LAPAROSCOPIC (CUSTOM PROCEDURE TRAY) ×3 IMPLANT
TUBING INSUFFLATION (TUBING) ×3 IMPLANT

## 2015-02-06 NOTE — Transfer of Care (Signed)
Immediate Anesthesia Transfer of Care Note  Patient: Adam Barber  Procedure(s) Performed: Procedure(s): LAPAROSCOPIC RIGHT INGUINAL HERNIA (Right) INSERTION OF MESH (Right) HERNIA REPAIR INGUINAL ADULT (Right)  Patient Location: PACU  Anesthesia Type:General  Level of Consciousness: awake, sedated and patient cooperative  Airway & Oxygen Therapy: Patient Spontanous Breathing and Patient connected to face mask oxygen  Post-op Assessment: Report given to RN and Post -op Vital signs reviewed and stable  Post vital signs: Reviewed and stable  Last Vitals:  Filed Vitals:   02/06/15 1120  BP: 130/68  Pulse: 60  Temp: 36.8 C  Resp: 20    Complications: No apparent anesthesia complications

## 2015-02-06 NOTE — Anesthesia Postprocedure Evaluation (Signed)
Anesthesia Post Note  Patient: Adam Barber  Procedure(Barber) Performed: Procedure(Barber) (LRB): LAPAROSCOPIC RIGHT INGUINAL HERNIA (Right) INSERTION OF MESH (Right) HERNIA REPAIR INGUINAL ADULT (Right)  Patient location during evaluation: PACU Anesthesia Type: General Level of consciousness: awake and alert and patient cooperative Pain management: pain level controlled Vital Signs Assessment: post-procedure vital signs reviewed and stable Respiratory status: spontaneous breathing and respiratory function stable Cardiovascular status: stable Anesthetic complications: no    Last Vitals:  Filed Vitals:   02/06/15 1445 02/06/15 1500  BP: 125/71 120/64  Pulse: 81 78  Temp:    Resp: 14 12    Last Pain:  Filed Vitals:   02/06/15 1509  PainSc: 3                  Adam Barber

## 2015-02-06 NOTE — Interval H&P Note (Signed)
History and Physical Interval Note:no change in H and P  02/06/2015 12:44 PM  Adam Barber [name]  has presented today for surgery, with the diagnosis of Right inguinal hernia  The various methods of treatment have been discussed with the patient and family. After consideration of risks, benefits and other options for treatment, the patient has consented to  Procedure(s): LAPAROSCOPIC RIGHT INGUINAL HERNIA (Right) INSERTION OF MESH (Right) as a surgical intervention .  The patient's history has been reviewed, patient examined, no change in status, stable for surgery.  I have reviewed the patient's chart and labs.  Questions were answered to the patient's satisfaction.     Carlen Rebuck A

## 2015-02-06 NOTE — Op Note (Signed)
LAPAROSCOPIC RIGHT INGUINAL HERNIA, INSERTION OF MESH, HERNIA REPAIR INGUINAL ADULT  Procedure Note  Percival SpanishCalvin Porr 02/06/2015   Pre-op Diagnosis: Right inguinal hernia     Post-op Diagnosis: same  Procedure(s): LAPAROSCOPIC CONVERTED TO OPEN RIGHT INGUINAL HERNIA REPAIR WITH MESH  Surgeon(s): Abigail Miyamotoouglas Kaloni Bisaillon, MD  Anesthesia: General  Staff:  Circulator: Julio Sicksonald P Ferguson, RN Scrub Person: Raliegh ScarletJudy G Burroughs, RN  Estimated Blood Loss: Minimal                         Deloras Reichard A   Date: 02/06/2015  Time: 2:15 PM

## 2015-02-06 NOTE — Anesthesia Procedure Notes (Signed)
Procedure Name: Intubation Date/Time: 02/06/2015 12:56 PM Performed by: Gar GibbonKEETON, Daleena Rotter S Pre-anesthesia Checklist: Patient identified, Emergency Drugs available, Suction available and Patient being monitored Patient Re-evaluated:Patient Re-evaluated prior to inductionOxygen Delivery Method: Circle System Utilized Preoxygenation: Pre-oxygenation with 100% oxygen Intubation Type: IV induction Ventilation: Mask ventilation without difficulty Laryngoscope Size: Miller and 3 Grade View: Grade II Tube type: Oral Tube size: 8.0 mm Number of attempts: 1 Airway Equipment and Method: Stylet and Oral airway Placement Confirmation: ETT inserted through vocal cords under direct vision,  positive ETCO2 and breath sounds checked- equal and bilateral Secured at: 22 cm Tube secured with: Tape Dental Injury: Teeth and Oropharynx as per pre-operative assessment

## 2015-02-06 NOTE — Anesthesia Preprocedure Evaluation (Signed)
Anesthesia Evaluation  Patient identified by MRN, date of birth, ID band Patient awake    Reviewed: Allergy & Precautions, NPO status , Patient's Chart, lab work & pertinent test results  Airway Mallampati: II   Neck ROM: full    Dental   Pulmonary former smoker,    breath sounds clear to auscultation       Cardiovascular negative cardio ROS   Rhythm:regular Rate:Normal     Neuro/Psych    GI/Hepatic   Endo/Other  obese  Renal/GU      Musculoskeletal  (+) Arthritis ,   Abdominal   Peds  Hematology   Anesthesia Other Findings   Reproductive/Obstetrics                             Anesthesia Physical Anesthesia Plan  ASA: II  Anesthesia Plan: General   Post-op Pain Management:    Induction: Intravenous  Airway Management Planned: Oral ETT  Additional Equipment:   Intra-op Plan:   Post-operative Plan: Extubation in OR  Informed Consent: I have reviewed the patients History and Physical, chart, labs and discussed the procedure including the risks, benefits and alternatives for the proposed anesthesia with the patient or authorized representative who has indicated his/her understanding and acceptance.     Plan Discussed with: CRNA, Anesthesiologist and Surgeon  Anesthesia Plan Comments:         Anesthesia Quick Evaluation

## 2015-02-06 NOTE — Discharge Instructions (Signed)
[facility] _______[facility] ° °UMBILICAL OR INGUINAL HERNIA REPAIR: POST OP INSTRUCTIONS ° °Always review your discharge instruction sheet given to you by the facility where your surgery was performed. °IF YOU HAVE DISABILITY OR FAMILY LEAVE FORMS, YOU MUST BRING THEM TO THE OFFICE FOR PROCESSING.   °DO NOT GIVE THEM TO YOUR DOCTOR. ° °1. A  prescription for pain medication may be given to you upon discharge.  Take your pain medication as prescribed, if needed.  If narcotic pain medicine is not needed, then you may take acetaminophen (Tylenol) or ibuprofen (Advil) as needed. °2. Take your usually prescribed medications unless otherwise directed. °3. If you need a refill on your pain medication, please contact your pharmacy.  They will contact our office to request authorization. Prescriptions will not be filled after 5 pm or on week-ends. °4. You should follow a light diet the first 24 hours after arrival home, such as soup and crackers, etc.  Be sure to include lots of fluids daily.  Resume your normal diet the day after surgery. °5. Most patients will experience some swelling and bruising around the umbilicus or in the groin and scrotum.  Ice packs and reclining will help.  Swelling and bruising can take several days to resolve.  °6. It is common to experience some constipation if taking pain medication after surgery.  Increasing fluid intake and taking a stool softener (such as Colace) will usually help or prevent this problem from occurring.  A mild laxative (Milk of Magnesia or Miralax) should be taken according to package directions if there are no bowel movements after 48 hours. °7. Unless discharge instructions indicate otherwise, you may remove your bandages 24-48 hours after surgery, and you may shower at that time.  You may have steri-strips (small skin tapes) in place directly over the incision.  These strips should be left on the skin for 7-10 days.  If your surgeon used skin glue on the  incision, you may shower in 24 hours.  The glue will flake off over the next 2-3 weeks.  Any sutures or staples will be removed at the office during your follow-up visit. °8. ACTIVITIES:  You may resume regular (light) daily activities beginning the next day--such as daily self-care, walking, climbing stairs--gradually increasing activities as tolerated.  You may have sexual intercourse when it is comfortable.  Refrain from any heavy lifting or straining until approved by your doctor. °a. You may drive when you are no longer taking prescription pain medication, you can comfortably wear a seatbelt, and you can safely maneuver your car and apply brakes. °b. RETURN TO WORK:  __________________________________________________________ °9. You should see your doctor in the office for a follow-up appointment approximately 2-3 weeks after your surgery.  Make sure that you call for this appointment within a day or two after you arrive home to insure a convenient appointment time. °10. OTHER INSTRUCTIONS: NO LIFTING MORE THAN 15 TO 20 POUNDS FOR 4 WEEKS °11. ICE PACK AND IBUPROFEN ALSO FOR PAIN __________________________________________________________________________________________________________________________________________________________________________________________  °WHEN TO CALL YOUR DOCTOR: °1. Fever over 101.0 °2. Inability to urinate °3. Nausea and/or vomiting °4. Extreme swelling or bruising °5. Continued bleeding from incision. °6. Increased pain, redness, or drainage from the incision ° °The clinic staff is available to answer your questions during regular business hours.  Please don’t hesitate to call and ask to speak to one of the nurses for clinical concerns.  If you have a medical emergency, go to the nearest emergency room or   call 911.  A surgeon from [facility] is always on call at the hospital ° ° °[address], [address], [place], [place]  [address] ? ° [address] [address],  [place], [place]   [address] °[phone] ? [phone] ? FAX [phone] °Web site: [email or web address] ° ° °Post Anesthesia Home Care Instructions ° °Activity: °Get plenty of rest for the remainder of the day. A responsible adult should stay with you for 24 hours following the procedure.  °For the next 24 hours, DO NOT: °-Drive a car °-Operate machinery °-Drink alcoholic beverages °-Take any medication unless instructed by your physician °-Make any legal decisions or sign important papers. ° °Meals: °Start with liquid foods such as gelatin or soup. Progress to regular foods as tolerated. Avoid greasy, spicy, heavy foods. If nausea and/or vomiting occur, drink only clear liquids until the nausea and/or vomiting subsides. Call your physician if vomiting continues. ° °Special Instructions/Symptoms: °Your throat may feel dry or sore from the anesthesia or the breathing tube placed in your throat during surgery. If this causes discomfort, gargle with warm salt water. The discomfort should disappear within 24 hours. ° °If you had a scopolamine patch placed behind your ear for the management of post- operative nausea and/or vomiting: ° °1. The medication in the patch is effective for 72 hours, after which it should be removed.  Wrap patch in a tissue and discard in the trash. Wash hands thoroughly with soap and water. °2. You may remove the patch earlier than 72 hours if you experience unpleasant side effects which may include dry mouth, dizziness or visual disturbances. °3. Avoid touching the patch. Wash your hands with soap and water after contact with the patch. °  ° °

## 2015-02-07 ENCOUNTER — Encounter (HOSPITAL_BASED_OUTPATIENT_CLINIC_OR_DEPARTMENT_OTHER): Payer: Self-pay | Admitting: Surgery

## 2015-02-07 NOTE — Op Note (Signed)
NAMPercival Spanish:  Douthit, Bakary                ACCOUNT NO.:  [id number]  MEDICAL RECORD NO.:  [id number]  LOCATION:                                 FACILITY:  PHYSICIAN:  Abigail Miyamotoouglas Florian Chauca, M.D.      DATE OF BIRTH:  DATE OF PROCEDURE:  02/06/2015 DATE OF DISCHARGE:                              OPERATIVE REPORT   PREOPERATIVE DIAGNOSIS:  Right inguinal hernia.  POSTOPERATIVE DIAGNOSIS:  Right inguinal hernia.  PROCEDURE:  Laparoscopic converted to open right inguinal hernia repair with mesh.  SURGEON:  Abigail Miyamotoouglas Delberta Folts, M.D.  ANESTHESIA:  General with 0.5% Marcaine.  ESTIMATED BLOOD LOSS:  Minimal.  INDICATIONS:  This is a 55 year old gentleman with a right inguinal hernia.  Decision was made to proceed with laparoscopic repair.  FINDINGS:  The patient was found to have a direct inguinal hernia.  I was not able to perform this laparoscopically secondary to inability of the dissecting balloon to dissect out the preperitoneal space appropriately.  I converted to an open repair and fix the hernia with a piece of Proceed Prolene ProGrip mesh.  PROCEDURE IN DETAIL:  The patient was brought to the operating room and identified as Ethelda ChickKelvin Malena.  She was placed supine on the operating table.  General anesthesia was induced.  His abdomen was then prepped and draped in usual sterile fashion.  A Foley catheter had been inserted.  I made a small vertical incision at the lower edge of the umbilicus.  I took this down to the fascia which was opened just to the right of midline.  The dissecting balloon was then inserted to dissect out the preperitoneal space.  Unfortunately, he had a large hole in the line of the peritoneum and going to the peritoneal cavity, I therefore had to remove the balloon.  I opened up the fascia in a separate portion just to the left of the midline and then was able to insert the balloon and repair preperitoneal space.  I then insufflated the balloon under direct vision.  I  did not dissect out the preperitoneal space well.  I did place two 5 mm ports in the patient's lower midline and tried to dissect out the testicular cord and structures, but it was quite difficult secondary to the poor dissection of the balloon.  At this point, a small bridging vein had to be clipped with surgical clips. This was down in the inguinal floor.  This was done with a 5 mm clip applier.  Again, I did not have success with the dissecting balloon, so for safety purposes, I decided to convert to an open procedure.  I deflated the preperitoneal space.  I then made a longitudinal incision in the patient's right groin.  I took this down through Scarpa's fascia with electrocautery.  The external oblique fascia was identified and opened through the internal and external rings.  The testicular cord and structures were controlled with Penrose drain.  The patient had no evidence of indirect hernia.  He had a large direct hernia defect.  I was able to reduce the hernia and then closed the floor over top of it with a 2-0 Vicryl suture.  I then brought  a piece of Proceed Prolene ProGrip mesh onto the field.  I placed it on the inguinal floor.  I brought around the cord structures.  I then secured it to the pubic tubercle with a 2-0 Vicryl suture.  I then closed the external oblique fascia over top of this with a running 2-0 Vicryl suture.  I then anesthetized the fascia and skin with Marcaine.  The Scarpa's fascia was then closed with interrupted 3-0 Vicryl sutures, and the skin was closed with running 4-0 Monocryl.  I then closed the fascial incision at the umbilicus with a figure-of-eight 0 Vicryl suture.  All incisions were then anesthetized with Marcaine and closed with skin glue.  The patient tolerated the procedure well.  All the counts were correct at the end of procedure.  The patient was then extubated in the operating room and taken in stable condition to the recovery  room.     Abigail Miyamoto, M.D.     DB/MEDQ  D:  02/06/2015  T:  02/06/2015  Job:  [id number]

## 2016-04-26 ENCOUNTER — Encounter (HOSPITAL_COMMUNITY): Payer: Self-pay | Admitting: Oncology

## 2016-04-26 ENCOUNTER — Emergency Department (HOSPITAL_COMMUNITY)
Admission: EM | Admit: 2016-04-26 | Discharge: 2016-04-26 | Disposition: A | Discharge status: Home / Self Care | Payer: BC Managed Care – PPO | Attending: Emergency Medicine | Admitting: Emergency Medicine

## 2016-04-26 DIAGNOSIS — Z87891 Personal history of nicotine dependence: Secondary | ICD-10-CM | POA: Diagnosis not present

## 2016-04-26 DIAGNOSIS — S199XXA Unspecified injury of neck, initial encounter: Secondary | ICD-10-CM | POA: Diagnosis present

## 2016-04-26 DIAGNOSIS — Y999 Unspecified external cause status: Secondary | ICD-10-CM | POA: Insufficient documentation

## 2016-04-26 DIAGNOSIS — Y939 Activity, unspecified: Secondary | ICD-10-CM | POA: Insufficient documentation

## 2016-04-26 DIAGNOSIS — Y92410 Unspecified street and highway as the place of occurrence of the external cause: Secondary | ICD-10-CM | POA: Insufficient documentation

## 2016-04-26 DIAGNOSIS — Z79899 Other long term (current) drug therapy: Secondary | ICD-10-CM | POA: Diagnosis not present

## 2016-04-26 DIAGNOSIS — S161XXA Strain of muscle, fascia and tendon at neck level, initial encounter: Secondary | ICD-10-CM | POA: Insufficient documentation

## 2016-04-26 MED ORDER — IBUPROFEN 600 MG PO TABS: 600.0000 mg | ORAL_TABLET | Freq: Four times a day (QID) | ORAL | 0 refills | Status: DC | PRN

## 2016-04-26 MED ORDER — CYCLOBENZAPRINE HCL 10 MG PO TABS: 10.0000 mg | ORAL_TABLET | Freq: Two times a day (BID) | ORAL | 0 refills | Status: DC | PRN
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
## 2020-04-10 ENCOUNTER — Other Ambulatory Visit: Payer: Self-pay | Admitting: Orthopedic Surgery

## 2020-04-10 DIAGNOSIS — R2232 Localized swelling, mass and lump, left upper limb: Secondary | ICD-10-CM

## 2020-05-01 ENCOUNTER — Ambulatory Visit
Admission: RE | Admit: 2020-05-01 | Discharge: 2020-05-01 | Disposition: A | Discharge status: Home / Self Care | Payer: BC Managed Care – PPO | Source: Ambulatory Visit | Attending: Orthopedic Surgery | Admitting: Orthopedic Surgery

## 2020-05-01 DIAGNOSIS — R2232 Localized swelling, mass and lump, left upper limb: Secondary | ICD-10-CM

## 2020-05-14 ENCOUNTER — Other Ambulatory Visit: Payer: Self-pay | Admitting: Orthopedic Surgery

## 2020-05-23 ENCOUNTER — Other Ambulatory Visit: Payer: Self-pay

## 2020-05-23 ENCOUNTER — Encounter (HOSPITAL_BASED_OUTPATIENT_CLINIC_OR_DEPARTMENT_OTHER): Payer: Self-pay | Admitting: Orthopedic Surgery

## 2020-05-29 ENCOUNTER — Other Ambulatory Visit (HOSPITAL_COMMUNITY): Payer: BC Managed Care – PPO

## 2020-05-30 NOTE — Progress Notes (Signed)
Sent message reminding pt to go for covid testing today before 3pm. 

## 2020-06-02 ENCOUNTER — Ambulatory Visit (HOSPITAL_BASED_OUTPATIENT_CLINIC_OR_DEPARTMENT_OTHER)
Admission: RE | Admit: 2020-06-02 | Payer: BC Managed Care – PPO | Source: Home / Self Care | Admitting: Orthopedic Surgery

## 2020-06-02 HISTORY — DX: Localized swelling, mass and lump, left upper limb: R22.32

## 2020-06-02 SURGERY — EXCISION MASS
Anesthesia: Choice | Laterality: Left

## 2021-12-16 ENCOUNTER — Ambulatory Visit
Admission: RE | Admit: 2021-12-16 | Discharge: 2021-12-16 | Disposition: A | Discharge status: Home / Self Care | Payer: BC Managed Care – PPO | Source: Ambulatory Visit | Attending: Family Medicine | Admitting: Family Medicine

## 2021-12-16 ENCOUNTER — Other Ambulatory Visit: Payer: Self-pay | Admitting: Family Medicine

## 2021-12-16 DIAGNOSIS — M546 Pain in thoracic spine: Secondary | ICD-10-CM

## 2022-01-30 IMAGING — US US EXTREM UP*L* LTD
2 series · 14 of 22 positions shown · non-contrast
Comparison: None.

CLINICAL DATA: Left hand mass near the base of the middle finger.

EXAM:
ULTRASOUND LEFT UPPER EXTREMITY LIMITED
TECHNIQUE: Ultrasound examination of the upper extremity soft tissues was
performed in the area of clinical concern.

[Series 1: us extrem up*left* ltd · 0.04mm/px · 13 acquisitions, 8 frames shown (1 of 2)]
[im 1/13]
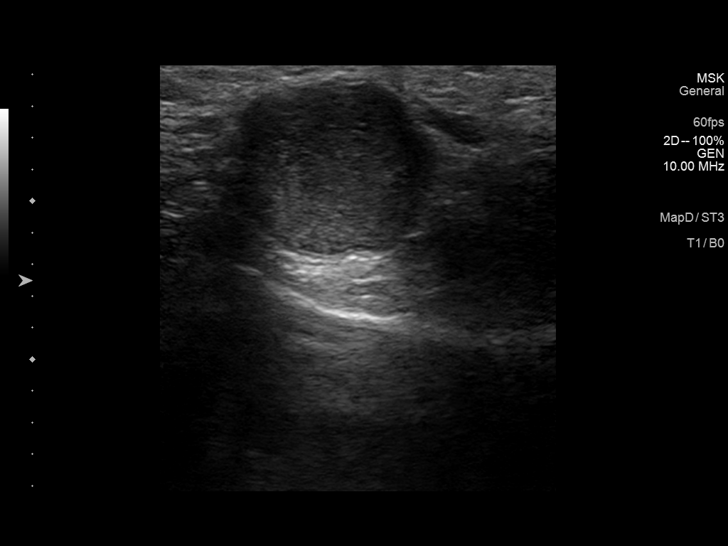
[im 3/13]
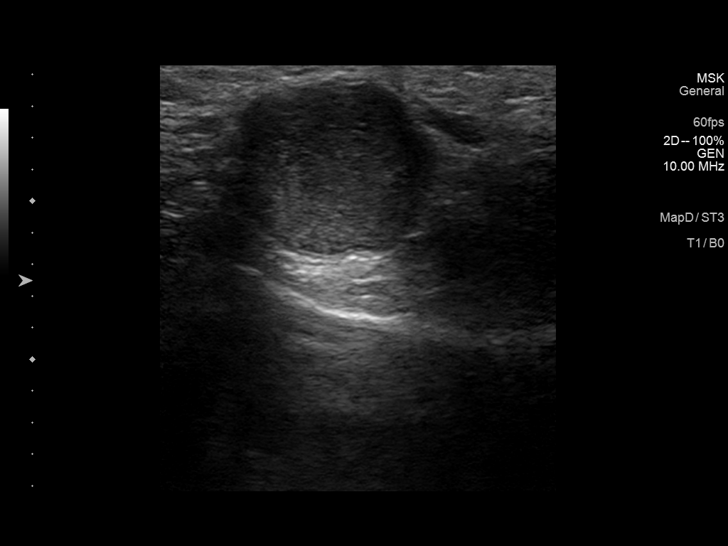
[im 4/13]
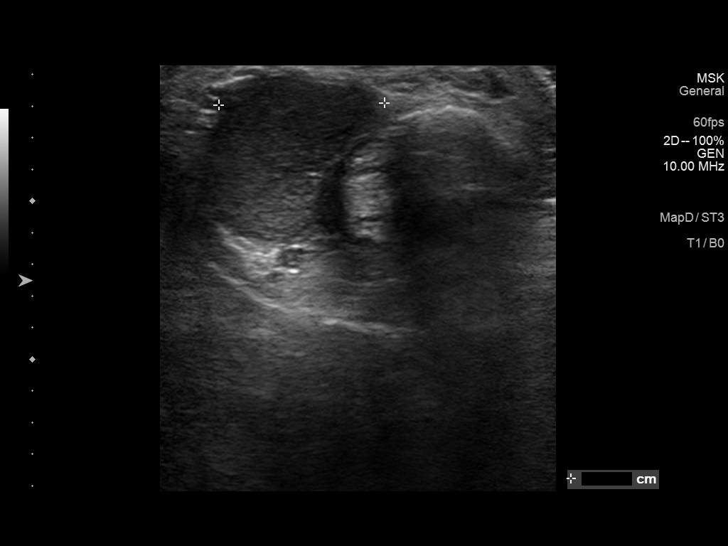
[im 6/13]
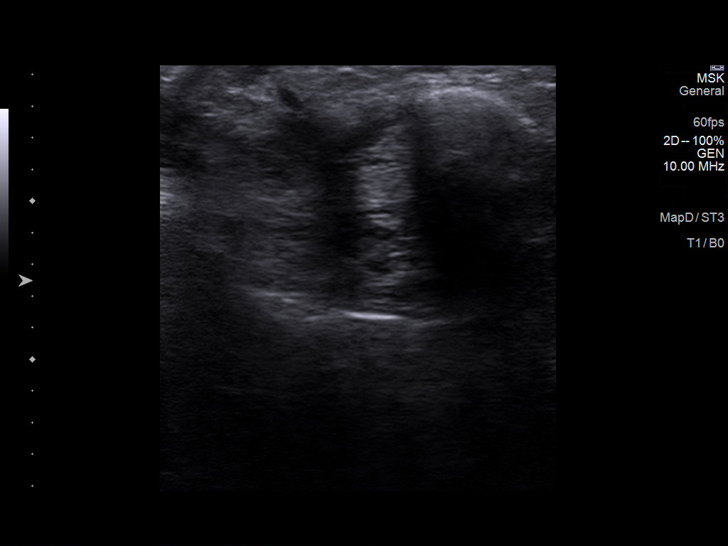
[im 8/13]
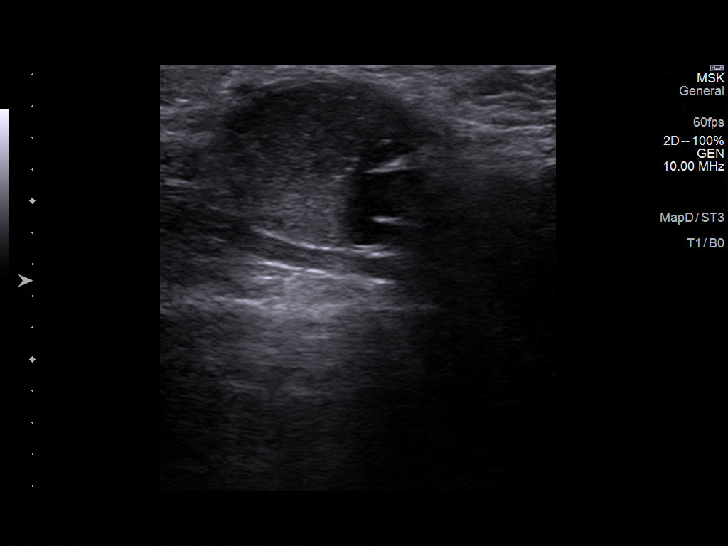
[im 9/13]
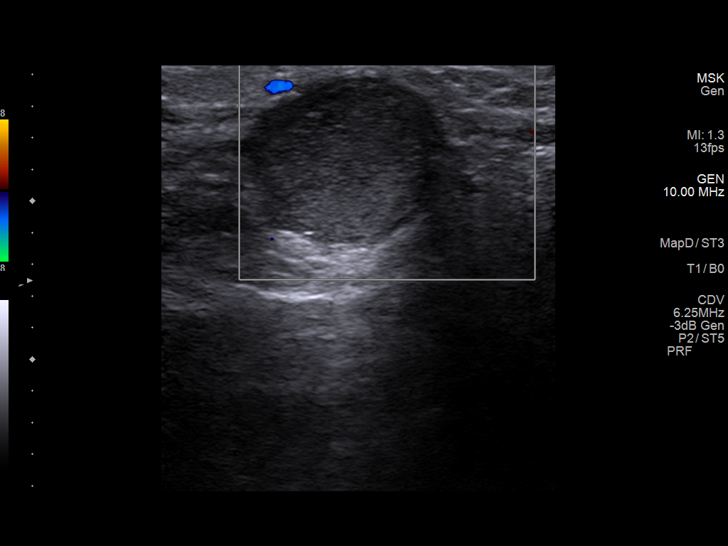
[im 11/13]
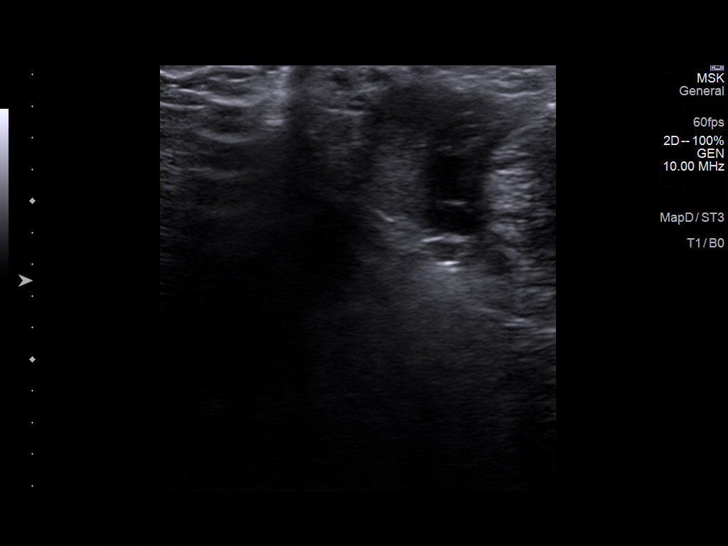
[im 12/13]
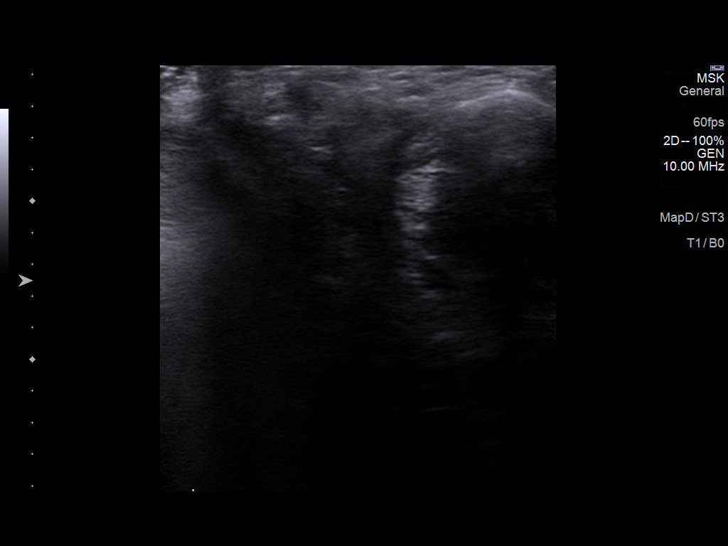

[Series 2: us extrem up*left* ltd · 0.04mm/px · 6 of 9 slices shown (2 of 2)]
[im 1/9]
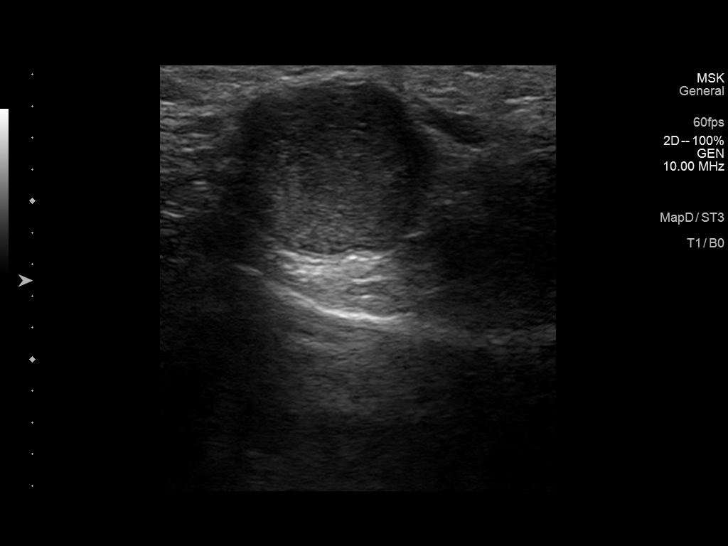
[im 2/9]
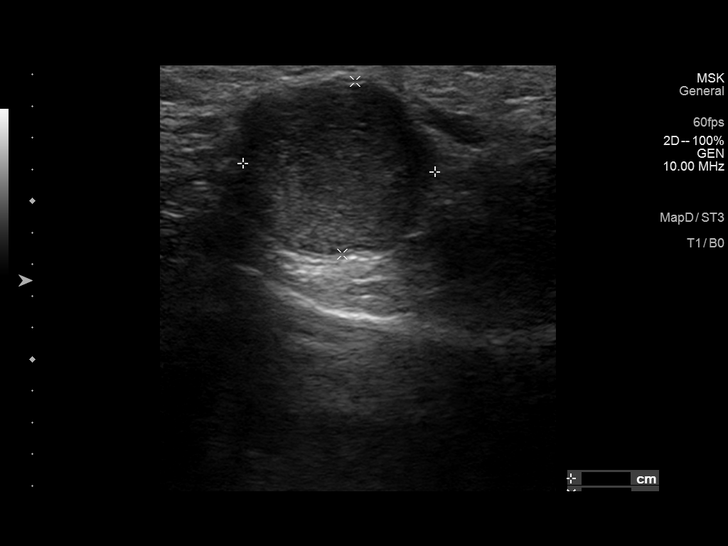
[im 4/9]
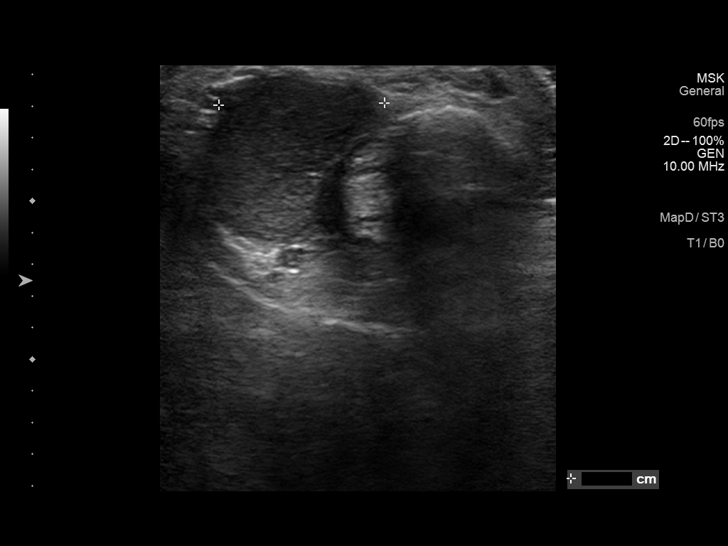
[im 6/9]
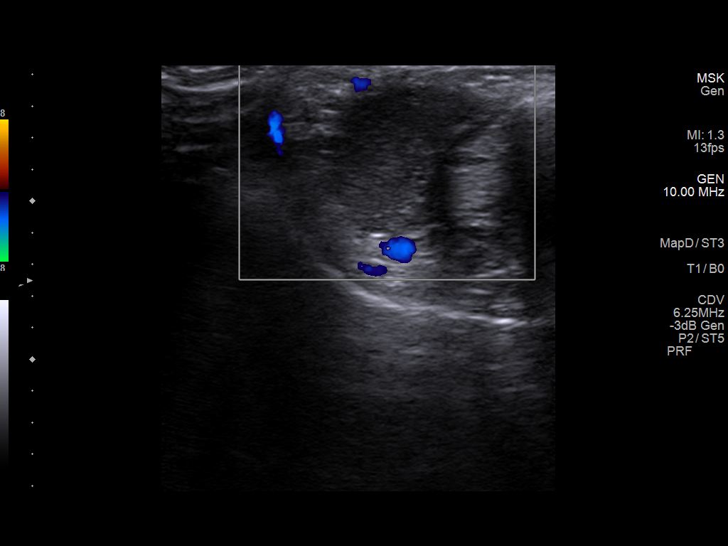
[im 7/9]
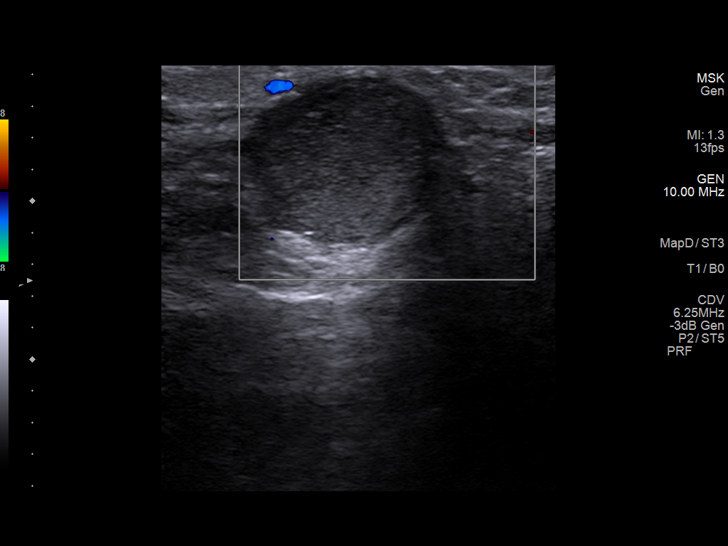
[im 9/9]
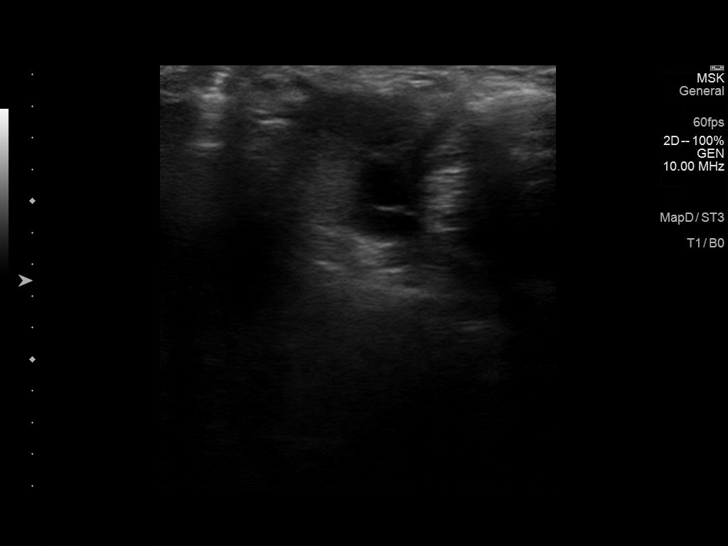

[14 of 22 positions shown; findings below may reference images not displayed]

FINDINGS: Focused ultrasound along the radial aspect of the left third
proximal phalanx demonstrates a round heterogeneously hypoechoic
mass measuring 1.2 x 1.1 x 1.1 cm. There is posterior acoustic
enhancement and no internal vascularity. The mass lies adjacent to
the flexor tendons.
IMPRESSION: 1. 1.2 cm complex cystic mass along the radial aspect of the left
third proximal phalanx, likely an epidermal inclusion cyst.

## 2022-05-27 ENCOUNTER — Ambulatory Visit
Admission: RE | Admit: 2022-05-27 | Discharge: 2022-05-27 | Disposition: A | Discharge status: Home / Self Care | Payer: BC Managed Care – PPO | Source: Ambulatory Visit | Attending: Family Medicine | Admitting: Family Medicine

## 2022-05-27 ENCOUNTER — Other Ambulatory Visit: Payer: Self-pay | Admitting: Family Medicine

## 2022-05-27 DIAGNOSIS — R0600 Dyspnea, unspecified: Secondary | ICD-10-CM

## 2022-06-29 ENCOUNTER — Institutional Professional Consult (permissible substitution): Payer: BC Managed Care – PPO | Admitting: Internal Medicine

## 2022-08-24 ENCOUNTER — Encounter: Payer: Self-pay | Admitting: Internal Medicine

## 2022-08-24 ENCOUNTER — Institutional Professional Consult (permissible substitution): Payer: BC Managed Care – PPO | Admitting: Internal Medicine

## 2022-08-24 VITALS — BP 104/62 | HR 52 | Temp 97.6°F | Ht 69.0 in | Wt 212.0 lb

## 2022-08-24 DIAGNOSIS — Z87891 Personal history of nicotine dependence: Secondary | ICD-10-CM

## 2022-08-24 DIAGNOSIS — J410 Simple chronic bronchitis: Secondary | ICD-10-CM | POA: Diagnosis not present

## 2022-08-24 MED ORDER — ALBUTEROL SULFATE HFA 108 (90 BASE) MCG/ACT IN AERS: 2.0000 | INHALATION_SPRAY | Freq: Four times a day (QID) | RESPIRATORY_TRACT | 5 refills | Status: AC | PRN

## 2022-08-24 NOTE — Progress Notes (Signed)
Adam Barber    [id number]    Oct 18, 1960  Primary Care Physician:Mitchell, L.August Saucer, MD  Referring Physician: Clovis Riley, Elbert Ewings.August Saucer, MD [address] [place],  [place] [address] Reason for Consultation: shortness of breath Date of Consultation: 08/24/2022  Chief complaint:   Chief Complaint  Patient presents with   Consult    SOB  CXR 05/27/22     HPI: Adam Barber is a 62 y.o. man who presents for new patient evaluation of shortness of breath. He had an episode of bronchitis this past winter. Had another episode in April 2024 and went to urgent care. Was treated with prednisone with improvement. This was his second time having bronchitis.   No childhood respiratory disease.   He denies dyspnea that limits his daily activities. He denies wheezing, coughing outside these  two episodes of bronchitis.  Has some back and shoulder pain but denies issues with sleep outside of that.   Social history:  Occupation: worked in [employer], stripping floors and waxing them, lots of chemical inhalants.  Exposures: lives at home independently, he is able to do all ADLs.  Smoking history: 20 pack years, quit in 2004.   Social History   Occupational History   Not on file  Tobacco Use   Smoking status: Former    Types: Cigarettes   Smokeless tobacco: Never  Substance and Sexual Activity   Alcohol use: No   Drug use: No   Sexual activity: Not on file    Relevant family history:  Family History  Problem Relation Age of Onset   Heart disease Mother    Migraines Mother    Heart disease Father    Kidney disease Father     Past Medical History:  Diagnosis Date   Arthritis    hands   Hypercholesteremia    Inguinal hernia    right   Mass of finger of left hand    long finger    Past Surgical History:  Procedure Laterality Date   COLONOSCOPY     HEMORROIDECTOMY     HERNIA REPAIR     INGUINAL HERNIA REPAIR Right 02/06/2015   Procedure: LAPAROSCOPIC RIGHT  INGUINAL HERNIA;  Surgeon: Abigail Miyamoto, MD;  Location: [facility];  Service: General;  Laterality: Right;   INGUINAL HERNIA REPAIR Right 02/06/2015   Procedure: HERNIA REPAIR INGUINAL ADULT;  Surgeon: Abigail Miyamoto, MD;  Location: [facility];  Service: General;  Laterality: Right;   INSERTION OF MESH Right 02/06/2015   Procedure: INSERTION OF MESH;  Surgeon: Abigail Miyamoto, MD;  Location: [facility];  Service: General;  Laterality: Right;     Physical Exam: Blood pressure 104/62, pulse (!) 52, temperature 97.6 F (36.4 C), temperature source Temporal, height 5\' 9"  (1.753 m), weight 212 lb (96.2 kg), SpO2 97%. Gen:      No acute distress ENT:  no nasal polyps, mucus membranes moist Lungs:    No increased respiratory effort, symmetric chest wall excursion, clear to auscultation bilaterally, no wheezes or crackles CV:         Regular rate and rhythm; no murmurs, rubs, or gallops.  No pedal edema Abd:      + bowel sounds; soft, non-tender; no distension MSK: no acute synovitis of DIP or PIP joints, no mechanics hands.  Skin:      Warm and dry; no rashes Neuro: normal speech, no focal facial asymmetry Psych: alert and oriented x3, normal mood  and affect   Data Reviewed/Medical Decision Making:  Independent interpretation of tests: Imaging:  Review of patient's chest xray April 2024 images revealed no acute process. The patient's images have been independently reviewed by me.    PFTs:  Labs:  Lab Results  Component Value Date   NA 138 05/06/2012   K 4.0 05/06/2012   CO2 22 05/06/2012   GLUCOSE 71 05/06/2012   BUN 15 05/06/2012   CREATININE 0.91 05/06/2012   CALCIUM 9.4 05/06/2012   GFRNONAA >90 05/06/2012   Lab Results  Component Value Date   WBC 7.1 05/06/2012   HGB 13.5 05/06/2012   HCT 40.0 05/06/2012   MCV 85.7 05/06/2012   PLT 194 05/06/2012      Immunization status:   There is no immunization history on file for  this patient.   I reviewed prior external note(s) from urgent care April 2024  I reviewed the result(s) of the labs and imaging as noted above.   I have ordered    Assessment:  Recurrent Bronchitis History of tobacco use  Plan/Recommendations:  He is at risk for COPD. Denies symptoms outside of two episodes of bronchitis in the last  Offered PFTS but he wants to hold off for now.  Will trial albuterol prn. He doesn't appear to have daily symptoms that inhibit quality of life.  He will come back and see me if symptoms worsen or progress.  We discussed disease management and progression at length today.    Return to Care: Return if symptoms worsen or fail to improve.  Durel Salts, MD Pulmonary and Critical Care Medicine [facility] Office:[phone]  CC: Clovis Riley, L.August Saucer, MD

## 2022-08-24 NOTE — Patient Instructions (Addendum)
If you develop worsening breathing problems please come back and see me.   We can do breathing testing at that time.   For now I am giving you an albuterol inhaler to take as needed for shortness of breath, coughing, wheezing if you get bronchitis.   You can try tylenol for your shoulder pain.

## 2023-10-06 ENCOUNTER — Other Ambulatory Visit (HOSPITAL_BASED_OUTPATIENT_CLINIC_OR_DEPARTMENT_OTHER): Payer: Self-pay | Admitting: Family Medicine

## 2023-10-06 DIAGNOSIS — E782 Mixed hyperlipidemia: Secondary | ICD-10-CM

## 2023-10-27 ENCOUNTER — Ambulatory Visit (HOSPITAL_COMMUNITY)
Admission: RE | Admit: 2023-10-27 | Discharge: 2023-10-27 | Disposition: A | Discharge status: Home / Self Care | Payer: Self-pay | Source: Ambulatory Visit | Attending: Family Medicine | Admitting: Family Medicine

## 2023-10-27 DIAGNOSIS — E782 Mixed hyperlipidemia: Secondary | ICD-10-CM | POA: Insufficient documentation

## 2024-02-29 ENCOUNTER — Ambulatory Visit (HOSPITAL_BASED_OUTPATIENT_CLINIC_OR_DEPARTMENT_OTHER)

## 2024-02-29 ENCOUNTER — Ambulatory Visit (HOSPITAL_BASED_OUTPATIENT_CLINIC_OR_DEPARTMENT_OTHER): Admitting: Student

## 2024-02-29 DIAGNOSIS — M7531 Calcific tendinitis of right shoulder: Secondary | ICD-10-CM

## 2024-02-29 DIAGNOSIS — M19011 Primary osteoarthritis, right shoulder: Secondary | ICD-10-CM

## 2024-02-29 DIAGNOSIS — M25512 Pain in left shoulder: Secondary | ICD-10-CM | POA: Diagnosis not present

## 2024-02-29 DIAGNOSIS — M7532 Calcific tendinitis of left shoulder: Secondary | ICD-10-CM | POA: Diagnosis not present

## 2024-02-29 DIAGNOSIS — M25511 Pain in right shoulder: Secondary | ICD-10-CM

## 2024-02-29 MED ORDER — BETAMETHASONE SOD PHOS & ACET 6 (3-3) MG/ML IJ SUSP
2.0000 mL | INTRAMUSCULAR | Status: AC | PRN
  Administered 2024-02-29: 2 mL via INTRA_ARTICULAR

## 2024-02-29 MED ORDER — LIDOCAINE HCL 1 % IJ SOLN
4.0000 mL | INTRAMUSCULAR | Status: AC | PRN
  Administered 2024-02-29: 4 mL

## 2024-02-29 NOTE — Progress Notes (Signed)
 "                                Chief Complaint: Bilateral shoulder pain    Discussed the use of AI scribe software for clinical note transcription with the patient, who gave verbal consent to proceed.  History of Present Illness Adam Barber is a 64 year old right-handed male who presents for evaluation of bilateral shoulder pain, right worse than left.  For 2-3 weeks he has had aching, throbbing bilateral shoulder pain, worse in the right anterior shoulder. Pain was initially severe at 10/10, has partially improved, and interferes with sleep. He notes stiffness and decreased range of motion, especially with reaching behind his back, and feels the right shoulder has lost motion. Early on the pain radiated down the arm but now stays more localized. Symptoms began after lifting a heavy front-load washing machine off a truck. He denies other recent injury or overuse and has never had shoulder surgery. He uses a heat pad with mild relief and is not regularly taking acetaminophen  or NSAIDs. He notes mild intermittent numbness or tingling and occasional popping and crepitus in the shoulders. He denies history of diabetes.   Surgical History:   None  PMH/PSH/Family History/Social History/Meds/Allergies:    Past Medical History:  Diagnosis Date   Arthritis    hands   Hypercholesteremia    Inguinal hernia    right   Mass of finger of left hand    long finger   Past Surgical History:  Procedure Laterality Date   COLONOSCOPY     HEMORROIDECTOMY     HERNIA REPAIR     INGUINAL HERNIA REPAIR Right 02/06/2015   Procedure: LAPAROSCOPIC RIGHT INGUINAL HERNIA;  Surgeon: Vicenta Poli, MD;  Location: [facility];  Service: General;  Laterality: Right;   INGUINAL HERNIA REPAIR Right 02/06/2015   Procedure: HERNIA REPAIR INGUINAL ADULT;  Surgeon: Vicenta Poli, MD;  Location: [facility];  Service: General;  Laterality: Right;   INSERTION OF MESH Right 02/06/2015    Procedure: INSERTION OF MESH;  Surgeon: Vicenta Poli, MD;  [facility];  Service: General;  Laterality: Right;   Social History   Socioeconomic History   Marital status: Single    Spouse name: Not on file   Number of children: Not on file   Years of education: Not on file   Highest education level: Not on file  Occupational History   Not on file  Tobacco Use   Smoking status: Former    Types: Cigarettes   Smokeless tobacco: Never  Substance and Sexual Activity   Alcohol use: No   Drug use: No   Sexual activity: Not on file  Other Topics Concern   Not on file  Social History Narrative   Not on file   Social Drivers of Health   Tobacco Use: Medium Risk (09/05/2023)   Received from [facility]   Patient History    Smoking Tobacco Use: Former    Smokeless Tobacco Use: Never    Passive Exposure: Not on Actuary Strain: Not on file  Food Insecurity: Not on file  Transportation Needs: Not on file  Physical Activity: Not on file  Stress: Not on file  Social Connections: Not on file  Depression (EYV7-0): Not on file  Alcohol Screen: Not on file  Housing: Not on file  Utilities: Not on file  Health Literacy:  Not on file   Family History  Problem Relation Age of Onset   Heart disease Mother    Migraines Mother    Heart disease Father    Kidney disease Father    Allergies[1] Current Outpatient Medications  Medication Sig Dispense Refill   albuterol  (VENTOLIN  HFA) 108 (90 Base) MCG/ACT inhaler Inhale 2 puffs into the lungs every 6 (six) hours as needed. 18 g 5   No current facility-administered medications for this visit.   No results found.  Review of Systems:   A ROS was performed including pertinent positives and negatives as documented in the HPI.  Physical Exam :   Constitutional: NAD and appears stated age Neurological: Alert and oriented Psych: Appropriate affect and cooperative There were no vitals taken for  this visit.   Comprehensive Musculoskeletal Exam:    Tenderness to palpation over bilateral anterior glenohumeral joints and lateral deltoids.  Active range of motion to 150 degrees flexion, 30 degrees external rotation, and internal rotation to L5 on the left and back pocket on right.  Positive right shoulder speeds and painful empty can.  Imaging:   Xray (right shoulder 3 views, left shoulder 3 views): Mild to moderate right shoulder osteoarthritis with calcific tendinitis.  Mild left shoulder osteoarthritis with more chronic appearing calcific tendinitis.  Negative for acute abnormalities.   I personally reviewed and interpreted the radiographs.      Assessment & Plan Right shoulder osteoarthritis and calcific tendinitis He has mild to moderate right glenohumeral osteoarthritis with calcific tendinitis, worsened by recent activity. Imaging confirmed degenerative changes and calcific deposits, with suspected biceps tendinitis contributing to anterior shoulder pain. Non-surgical management is preferred. A right shoulder ultrasound was performed for evaluation and injection guidance. A corticosteroid injection was administered into the glenohumeral joint. Symptomatic relief and improved function are anticipated, with further interventions available if needed.  Left shoulder osteoarthritis and calcific tendinitis He has mild left shoulder osteoarthritis and calcific tendinitis with minimal symptoms and no significant functional limitation. Imaging shows mild degenerative changes and calcific deposits. No intervention is planned at this time unless symptoms worsen.    Procedure Note  Patient: Adam Barber             Date of Birth: 12-29-1960           MRN: [id number]             Visit Date: 02/29/2024  Procedures: Visit Diagnoses:  1. Primary osteoarthritis, right shoulder   2. Calcific tendinitis of both shoulders     Large Joint Inj: R glenohumeral on 02/29/2024 12:46  PM Indications: pain Details: 22 G 1.5 in needle, anterior approach Medications: 4 mL lidocaine  1 %; 2 mL betamethasone  acetate-betamethasone  sodium phosphate  6 (3-3) MG/ML Outcome: tolerated well, no immediate complications Procedure, treatment alternatives, risks and benefits explained, specific risks discussed. Consent was given by the patient. Immediately prior to procedure a time out was called to verify the correct patient, procedure, equipment, support staff and site/side marked as required. Patient was prepped and draped in the usual sterile fashion.       I personally saw and evaluated the patient, and participated in the management and treatment plan.  Leonce Reveal, PA-C Orthopedics    [1]  Allergies Allergen Reactions   Penicillins     hives   Acyclovir Other (See Comments)    Joint pain   Sildenafil Other (See Comments)   Tadalafil Other (See Comments)   "
